# Patient Record
Sex: Female | Born: 1975 | Race: White | Hispanic: No | Marital: Single | Smoking: Current every day smoker
Health system: Southern US, Community
[De-identification: ages and names within clinical notes are randomized; demographics above are authoritative.]

## PROBLEM LIST (undated history)

## (undated) DIAGNOSIS — N159 Renal tubulo-interstitial disease, unspecified: Secondary | ICD-10-CM

## (undated) DIAGNOSIS — M549 Dorsalgia, unspecified: Secondary | ICD-10-CM

## (undated) DIAGNOSIS — F419 Anxiety disorder, unspecified: Secondary | ICD-10-CM

## (undated) DIAGNOSIS — IMO0002 Reserved for concepts with insufficient information to code with codable children: Secondary | ICD-10-CM

## (undated) DIAGNOSIS — G709 Myoneural disorder, unspecified: Secondary | ICD-10-CM

## (undated) DIAGNOSIS — R87619 Unspecified abnormal cytological findings in specimens from cervix uteri: Secondary | ICD-10-CM

## (undated) DIAGNOSIS — K219 Gastro-esophageal reflux disease without esophagitis: Secondary | ICD-10-CM

## (undated) DIAGNOSIS — F329 Major depressive disorder, single episode, unspecified: Secondary | ICD-10-CM

## (undated) DIAGNOSIS — F32A Depression, unspecified: Secondary | ICD-10-CM

## (undated) DIAGNOSIS — G8929 Other chronic pain: Secondary | ICD-10-CM

## (undated) HISTORY — PX: CYSTOSCOPY WITH URETHRAL DILATATION: SHX5125

## (undated) HISTORY — PX: WISDOM TOOTH EXTRACTION: SHX21

## (undated) HISTORY — DX: Reserved for concepts with insufficient information to code with codable children: IMO0002

## (undated) HISTORY — DX: Myoneural disorder, unspecified: G70.9

## (undated) HISTORY — DX: Gastro-esophageal reflux disease without esophagitis: K21.9

## (undated) HISTORY — DX: Unspecified abnormal cytological findings in specimens from cervix uteri: R87.619

---

## 2000-10-06 HISTORY — PX: COLPOSCOPY: SHX161

## 2007-08-19 ENCOUNTER — Emergency Department (HOSPITAL_COMMUNITY): Admission: EM | Admit: 2007-08-19 | Discharge: 2007-08-19 | Payer: Self-pay | Admitting: Emergency Medicine

## 2008-10-08 ENCOUNTER — Emergency Department (HOSPITAL_COMMUNITY): Admission: EM | Admit: 2008-10-08 | Discharge: 2008-10-08 | Payer: Self-pay | Admitting: Emergency Medicine

## 2008-10-26 ENCOUNTER — Emergency Department (HOSPITAL_COMMUNITY): Admission: EM | Admit: 2008-10-26 | Discharge: 2008-10-26 | Payer: Self-pay | Admitting: Emergency Medicine

## 2008-12-06 ENCOUNTER — Emergency Department (HOSPITAL_COMMUNITY): Admission: EM | Admit: 2008-12-06 | Discharge: 2008-12-06 | Payer: Self-pay | Admitting: Emergency Medicine

## 2008-12-25 ENCOUNTER — Emergency Department (HOSPITAL_COMMUNITY): Admission: EM | Admit: 2008-12-25 | Discharge: 2008-12-25 | Payer: Self-pay | Admitting: Emergency Medicine

## 2009-12-28 ENCOUNTER — Emergency Department (HOSPITAL_COMMUNITY): Admission: EM | Admit: 2009-12-28 | Discharge: 2009-12-28 | Payer: Self-pay | Admitting: Emergency Medicine

## 2010-04-09 ENCOUNTER — Emergency Department (HOSPITAL_COMMUNITY): Admission: EM | Admit: 2010-04-09 | Discharge: 2010-04-09 | Payer: Self-pay | Admitting: Emergency Medicine

## 2010-05-31 ENCOUNTER — Emergency Department (HOSPITAL_COMMUNITY): Admission: EM | Admit: 2010-05-31 | Discharge: 2010-05-31 | Payer: Self-pay | Admitting: Emergency Medicine

## 2010-10-13 ENCOUNTER — Emergency Department: Payer: Self-pay | Admitting: Unknown Physician Specialty

## 2010-11-28 ENCOUNTER — Emergency Department (HOSPITAL_COMMUNITY)
Admission: EM | Admit: 2010-11-28 | Discharge: 2010-11-28 | Discharge status: Against Medical Advice | Payer: Self-pay | Attending: Emergency Medicine | Admitting: Emergency Medicine

## 2010-11-29 ENCOUNTER — Emergency Department (HOSPITAL_COMMUNITY)
Admission: EM | Admit: 2010-11-29 | Discharge: 2010-11-29 | Disposition: A | Discharge status: Home / Self Care | Payer: Self-pay | Attending: Emergency Medicine | Admitting: Emergency Medicine

## 2010-11-29 DIAGNOSIS — L02219 Cutaneous abscess of trunk, unspecified: Secondary | ICD-10-CM | POA: Insufficient documentation

## 2010-11-29 DIAGNOSIS — R21 Rash and other nonspecific skin eruption: Secondary | ICD-10-CM | POA: Insufficient documentation

## 2010-11-29 DIAGNOSIS — K047 Periapical abscess without sinus: Secondary | ICD-10-CM | POA: Insufficient documentation

## 2010-12-02 LAB — CULTURE, ROUTINE-ABSCESS

## 2010-12-09 ENCOUNTER — Emergency Department (HOSPITAL_COMMUNITY)
Admission: EM | Admit: 2010-12-09 | Discharge: 2010-12-09 | Disposition: A | Discharge status: Home / Self Care | Payer: Self-pay | Attending: Emergency Medicine | Admitting: Emergency Medicine

## 2010-12-09 DIAGNOSIS — R21 Rash and other nonspecific skin eruption: Secondary | ICD-10-CM | POA: Insufficient documentation

## 2010-12-09 DIAGNOSIS — R109 Unspecified abdominal pain: Secondary | ICD-10-CM | POA: Insufficient documentation

## 2010-12-20 LAB — URINE CULTURE
Colony Count: NO GROWTH
Culture  Setup Time: 201108262125
Culture: NO GROWTH

## 2010-12-20 LAB — POCT PREGNANCY, URINE: Preg Test, Ur: NEGATIVE

## 2010-12-20 LAB — URINALYSIS, ROUTINE W REFLEX MICROSCOPIC
Bilirubin Urine: NEGATIVE
Glucose, UA: NEGATIVE mg/dL
Hgb urine dipstick: NEGATIVE
Ketones, ur: NEGATIVE mg/dL
Nitrite: NEGATIVE
Protein, ur: NEGATIVE mg/dL
Specific Gravity, Urine: 1.02 (ref 1.005–1.030)
Urobilinogen, UA: 0.2 mg/dL (ref 0.0–1.0)
pH: 6 (ref 5.0–8.0)

## 2010-12-20 LAB — URINE MICROSCOPIC-ADD ON

## 2011-01-16 LAB — URINALYSIS, ROUTINE W REFLEX MICROSCOPIC
Glucose, UA: NEGATIVE mg/dL
Hgb urine dipstick: NEGATIVE
Ketones, ur: NEGATIVE mg/dL
Protein, ur: NEGATIVE mg/dL
Specific Gravity, Urine: <1.005 — ABNORMAL LOW (ref 1.005–1.030)
pH: 6 (ref 5.0–8.0)

## 2011-01-16 LAB — WET PREP, GENITAL
Trich, Wet Prep: NONE SEEN
Yeast Wet Prep HPF POC: NONE SEEN

## 2011-01-16 LAB — GC/CHLAMYDIA PROBE AMP, GENITAL
Chlamydia, DNA Probe: NEGATIVE
GC Probe Amp, Genital: NEGATIVE

## 2011-01-16 LAB — URINE CULTURE

## 2011-02-16 ENCOUNTER — Emergency Department (HOSPITAL_COMMUNITY)
Admission: EM | Admit: 2011-02-16 | Discharge: 2011-02-16 | Disposition: A | Discharge status: Home / Self Care | Payer: Self-pay | Attending: Emergency Medicine | Admitting: Emergency Medicine

## 2011-02-16 DIAGNOSIS — L738 Other specified follicular disorders: Secondary | ICD-10-CM | POA: Insufficient documentation

## 2011-03-14 ENCOUNTER — Emergency Department (HOSPITAL_COMMUNITY)
Admission: EM | Admit: 2011-03-14 | Discharge: 2011-03-14 | Disposition: A | Discharge status: Home / Self Care | Payer: Self-pay | Attending: Emergency Medicine | Admitting: Emergency Medicine

## 2011-03-14 DIAGNOSIS — K089 Disorder of teeth and supporting structures, unspecified: Secondary | ICD-10-CM | POA: Insufficient documentation

## 2011-03-14 DIAGNOSIS — K029 Dental caries, unspecified: Secondary | ICD-10-CM | POA: Insufficient documentation

## 2011-03-14 DIAGNOSIS — K651 Peritoneal abscess: Secondary | ICD-10-CM | POA: Insufficient documentation

## 2011-03-14 DIAGNOSIS — F17200 Nicotine dependence, unspecified, uncomplicated: Secondary | ICD-10-CM | POA: Insufficient documentation

## 2011-07-15 LAB — CBC
HCT: 36.4
Hemoglobin: 12.3
MCV: 91.6
Platelets: 190
RBC: 3.98
WBC: 8.8

## 2011-07-15 LAB — COMPREHENSIVE METABOLIC PANEL
ALT: 24
Alkaline Phosphatase: 82
GFR calc non Af Amer: >60
Total Bilirubin: 0.4

## 2011-07-15 LAB — URINALYSIS, ROUTINE W REFLEX MICROSCOPIC
Glucose, UA: NEGATIVE
Specific Gravity, Urine: 1.01

## 2011-07-15 LAB — URINE MICROSCOPIC-ADD ON

## 2011-07-15 LAB — LIPASE, BLOOD: Lipase: 33

## 2011-07-15 LAB — DIFFERENTIAL
Basophils Absolute: 0
Basophils Relative: 0
Lymphocytes Relative: 30
Lymphs Abs: 2.6
Monocytes Relative: 10
Neutrophils Relative %: 58

## 2011-07-15 LAB — PREGNANCY, URINE: Preg Test, Ur: NEGATIVE

## 2011-11-29 ENCOUNTER — Encounter (HOSPITAL_COMMUNITY): Payer: Self-pay

## 2011-11-29 ENCOUNTER — Emergency Department (HOSPITAL_COMMUNITY)
Admission: EM | Admit: 2011-11-29 | Discharge: 2011-11-29 | Disposition: A | Discharge status: Home / Self Care | Payer: Self-pay | Attending: Emergency Medicine | Admitting: Emergency Medicine

## 2011-11-29 DIAGNOSIS — F4321 Adjustment disorder with depressed mood: Secondary | ICD-10-CM | POA: Insufficient documentation

## 2011-11-29 DIAGNOSIS — F17200 Nicotine dependence, unspecified, uncomplicated: Secondary | ICD-10-CM | POA: Insufficient documentation

## 2011-11-29 DIAGNOSIS — F410 Panic disorder [episodic paroxysmal anxiety] without agoraphobia: Secondary | ICD-10-CM | POA: Insufficient documentation

## 2011-11-29 HISTORY — DX: Depression, unspecified: F32.A

## 2011-11-29 HISTORY — DX: Anxiety disorder, unspecified: F41.9

## 2011-11-29 HISTORY — DX: Major depressive disorder, single episode, unspecified: F32.9

## 2011-11-29 LAB — BASIC METABOLIC PANEL
BUN: 17 mg/dL (ref 6–23)
Calcium: 9 mg/dL (ref 8.4–10.5)
Glucose, Bld: 115 mg/dL — ABNORMAL HIGH (ref 70–99)
Sodium: 141 mEq/L (ref 135–145)

## 2011-11-29 LAB — RAPID URINE DRUG SCREEN, HOSP PERFORMED
Amphetamines: NOT DETECTED
Opiates: POSITIVE — AB

## 2011-11-29 LAB — DIFFERENTIAL
Neutro Abs: 5.1 10*3/uL (ref 1.7–7.7)
Neutrophils Relative %: 60 % (ref 43–77)

## 2011-11-29 LAB — CBC
HCT: 39.9 % (ref 36.0–46.0)
Hemoglobin: 13.6 g/dL (ref 12.0–15.0)
MCH: 31.5 pg (ref 26.0–34.0)
MCHC: 34.1 g/dL (ref 30.0–36.0)
MCV: 92.4 fL (ref 78.0–100.0)

## 2011-11-29 LAB — ETHANOL: Alcohol, Ethyl (B): <11 mg/dL (ref 0–11)

## 2011-11-29 MED ORDER — DIAZEPAM 5 MG PO TABS: ORAL_TABLET | ORAL | Status: DC

## 2011-11-29 MED ORDER — ONDANSETRON HCL 4 MG PO TABS: 4.0000 mg | ORAL_TABLET | Freq: Four times a day (QID) | ORAL | Status: AC | PRN

## 2011-11-29 NOTE — ED Provider Notes (Signed)
History     CSN: [id number]  Arrival date & time 11/29/11  1731   First MD Initiated Contact with Patient 11/29/11 1746      Chief Complaint  Patient presents with  . Panic Attack    HPI Pt was seen at 1935.  Per pt, c/o gradual onset and persistence of intermittent episodes of anxiety for the past 2 weeks.  Pt states her symptoms began after her Mother died 2 weeks ago; pt was her sole caregiver before her death.  Pt states she has been experiencing intermittent nausea, crying, and "thinking about her," which interferes with her ability to concentrate at work or read her bible.  Endorses her symptoms worsen when she looks at her mothers room or when she sorts through her belongings; symptoms improve when she "keeps herself busy" running errands out of the house, visiting friends/family, and going to work.  Has been assoc with sadness.  Pt states she came to the ED because "I wanted to make sure I wasn't going crazy" and "to see if this was normal."  Denies SI/SA, no HI.   Past Medical History  Diagnosis Date  . Anxiety   . Depression     History reviewed. No pertinent past surgical history.   History  Substance Use Topics  . Smoking status: Current Everyday Smoker -- 2.0 packs/day  . Smokeless tobacco: Not on file  . Alcohol Use: Yes    Review of Systems ROS: Statement: All systems negative except as marked or noted in the HPI; Constitutional: Negative for fever and chills. ; ; Eyes: Negative for eye pain, redness and discharge. ; ; ENMT: Negative for ear pain, hoarseness, nasal congestion, sinus pressure and sore throat. ; ; Cardiovascular: Negative for chest pain, palpitations, diaphoresis, dyspnea and peripheral edema. ; ; Respiratory: Negative for cough, wheezing and stridor. ; ; Gastrointestinal: Negative for nausea, vomiting, diarrhea, abdominal pain, blood in stool, hematemesis, jaundice and rectal bleeding. . ; ; Genitourinary: Negative for dysuria, flank pain and  hematuria. ; ; Musculoskeletal: Negative for back pain and neck pain. Negative for swelling and trauma.; ; Skin: Negative for pruritus, rash, abrasions, blisters, bruising and skin lesion.; ; Neuro: Negative for headache, lightheadedness and neck stiffness. Negative for weakness, altered level of consciousness , altered mental status, extremity weakness, paresthesias, involuntary movement, seizure and syncope.; Psych:  No SI, no SA, no HI, no hallucinations, +tearful, anxious, depressed.   Allergies  Shellfish allergy  Home Medications   Current Outpatient Rx  Name Route Sig Dispense Refill  . GABAPENTIN 300 MG PO CAPS Oral Take 300 mg by mouth 2 (two) times daily.    Marland Kitchen ONDANSETRON HCL 8 MG PO TABS Oral Take 8 mg by mouth every 4 (four) hours as needed. nausea    . RANITIDINE HCL 150 MG PO TABS Oral Take 150 mg by mouth 2 (two) times daily.      BP 122/92  Pulse 80  Temp(Src) 98.3 F (36.8 C) (Oral)  Ht 5\' 3"  (1.6 m)  Wt 150 lb (68.04 kg)  BMI 26.57 kg/m2  SpO2 99%  Physical Exam 1940: Physical examination:  Nursing notes reviewed; Vital signs and O2 SAT reviewed;  Constitutional: Well developed, Well nourished, Well hydrated, In no acute distress; Head:  Normocephalic, atraumatic; Eyes: EOMI, PERRL, No scleral icterus; ENMT: Mouth and pharynx normal, Mucous membranes moist; Neck: Supple, Full range of motion; Cardiovascular: Regular rate and rhythm; Respiratory: Breath sounds clear, No wheezes, Normal respiratory effort/excursion; Chest: Nontender, Movement normal;  Abdomen: Soft, Nondistended; Extremities: Pulses normal, No tenderness, No edema, No calf edema or asymmetry.; Neuro: AA&Ox3, Major CN grossly intact. No facial droop, speech clear. Gait steady.  No gross focal motor or sensory deficits in extremities.; Skin: Color normal, Warm, Dry; Psych:  Tearful, no SI.    ED Course  Procedures   MDM  MDM Reviewed: nursing note and vitals Interpretation: labs   Results for  orders placed during the hospital encounter of 11/29/11  CBC      Component Value Range   WBC 8.5  4.0 - 10.5 (K/uL)   RBC 4.32  3.87 - 5.11 (MIL/uL)   Hemoglobin 13.6  12.0 - 15.0 (g/dL)   HCT 09.8  11.9 - 14.7 (%)   MCV 92.4  78.0 - 100.0 (fL)   MCH 31.5  26.0 - 34.0 (pg)   MCHC 34.1  30.0 - 36.0 (g/dL)   RDW 82.9  56.2 - 13.0 (%)   Platelets 123 (*) 150 - 400 (K/uL)  DIFFERENTIAL      Component Value Range   Neutrophils Relative 60  43 - 77 (%)   Neutro Abs 5.1  1.7 - 7.7 (K/uL)   Lymphocytes Relative 32  12 - 46 (%)   Lymphs Abs 2.7  0.7 - 4.0 (K/uL)   Monocytes Relative 7  3 - 12 (%)   Monocytes Absolute 0.6  0.1 - 1.0 (K/uL)   Eosinophils Relative 2  0 - 5 (%)   Eosinophils Absolute 0.1  0.0 - 0.7 (K/uL)   Basophils Relative 0  0 - 1 (%)   Basophils Absolute 0.0  0.0 - 0.1 (K/uL)  BASIC METABOLIC PANEL      Component Value Range   Sodium 141  135 - 145 (mEq/L)   Potassium 3.4 (*) 3.5 - 5.1 (mEq/L)   Chloride 109  96 - 112 (mEq/L)   CO2 25  19 - 32 (mEq/L)   Glucose, Bld 115 (*) 70 - 99 (mg/dL)   BUN 17  6 - 23 (mg/dL)   Creatinine, Ser 8.65  0.50 - 1.10 (mg/dL)   Calcium 9.0  8.4 - 78.4 (mg/dL)   GFR calc non Af Amer >90  >90 (mL/min)   GFR calc Af Amer >90  >90 (mL/min)  URINE RAPID DRUG SCREEN (HOSP PERFORMED)      Component Value Range   Opiates POSITIVE (*) NONE DETECTED    Cocaine POSITIVE (*) NONE DETECTED    Benzodiazepines POSITIVE (*) NONE DETECTED    Amphetamines NONE DETECTED  NONE DETECTED    Tetrahydrocannabinol POSITIVE (*) NONE DETECTED    Barbiturates NONE DETECTED  NONE DETECTED   ETHANOL      Component Value Range   Alcohol, Ethyl (B) <11  0 - 11 (mg/dL)     6:96 PM:  Very long d/w pt regarding grief/grief reaction.  Calmer now and wants to go home, states she feels better after talking.  Has her grandmother, friend, and preacher she can speak with.  Endorses she has been trying to get ahold of her former [facility] counselor this past week,  encouraged to keep trying.  Requesting valium to take instead of the ativan (states she has taken it before and "it worked better without making me loopy") and an anti-emetic.  She will take benadryl to help her sleep.  D/W ACT Samara Deist who gave outpt referrals.  Pt wants to go home now, calmer, continues to deny SI.  Will d/c and encourage outpt f/u.  Laray Anger, DO 12/01/11 1339

## 2011-11-29 NOTE — Discharge Instructions (Signed)
RESOURCE GUIDE  Dental Problems  Patients with Medicaid: [facility]                     [facility] [address]                                           [address] Phone:  [phone]                                                  Phone:  [phone]  If unable to pay or uninsured, contact:  [facility] or [facility] to become qualified for the adult dental clinic.  Chronic Pain Problems Contact [facility]  [phone] Patients need to be referred by their primary care doctor.  Insufficient Money for Medicine Contact [employer]:  call "211" or [facility] 271-5999.  No Primary Care Doctor Call [facility]  832-8000 Other agencies that provide inexpensive medical care    [facility]  [phone]    [facility]  [phone]    [facility]  [phone]    Women[facility] Clinic  [phone]    [facility]  [phone]    [facility]  [phone]  Psychological Services [facility]  [phone] [facility]  [phone] [facility]   [phone] (emergency services 641-4993)  Substance Abuse Resources [facility]  336-882-2125 [facility] 336-784-9470 [facility] 336-285-9073 [facility] 336-845-3988 Residential & Outpatient Substance Abuse Program  800-659-3381  Abuse/Neglect [place] Child Abuse Hotline (336) 641-3795 [place] Child Abuse Hotline 800-378-5315 (After Hours)  Emergency Shelter [facility] (336) 271-5985  M[facility] Homes [facility] (336) 275-9566 [facility] (704) 372-4663  MRSA Hotline #:   832-7006    [place] [facility]  [facility] [place]     [employer]                          [facility] [address] [place]                       [address]      [address]  [place]                                                [place]                            [place] Phone:  [phone]                                   Phone:  [phone]                 Phone:  [phone]  [facility] Phone:  [phone]    [facility] Child Abuse Hotline 201-086-3578 501-406-2964 (After Hours)    Take the prescriptions as directed.  Call your regular medical doctor and the mental health referrals given to you today on Monday morning to schedule a follow up appointment within the next 3 to 4 days.  Return to the Emergency Department immediately sooner if worsening.

## 2011-11-29 NOTE — ED Notes (Signed)
Pt states is here for "panic attack". Pt states she was very closer and strong for her Mother,  But mother has recently passed away. Pt reports inability to sleep, feels like the walls are closing in, states she can not read,can not focus or function on a daily. Pt reports taking ativan,. But doesn't like it because it makes her "zoned out".  Pt reports walking off job today because she can't function. Pt reports depression, feeling like there is no reason to take a shower daily. Pt denies SI/HI.

## 2011-11-29 NOTE — ED Notes (Signed)
Pt presents with anxiety x 2 weeks. Pt states her mother passed away 2 weeks ago.

## 2011-12-01 ENCOUNTER — Emergency Department (HOSPITAL_COMMUNITY)
Admission: EM | Admit: 2011-12-01 | Discharge: 2011-12-01 | Disposition: A | Discharge status: Home / Self Care | Payer: Self-pay | Attending: Emergency Medicine | Admitting: Emergency Medicine

## 2011-12-01 ENCOUNTER — Encounter (HOSPITAL_COMMUNITY): Payer: Self-pay | Admitting: *Deleted

## 2011-12-01 DIAGNOSIS — R220 Localized swelling, mass and lump, head: Secondary | ICD-10-CM | POA: Insufficient documentation

## 2011-12-01 DIAGNOSIS — K089 Disorder of teeth and supporting structures, unspecified: Secondary | ICD-10-CM | POA: Insufficient documentation

## 2011-12-01 DIAGNOSIS — R221 Localized swelling, mass and lump, neck: Secondary | ICD-10-CM | POA: Insufficient documentation

## 2011-12-01 DIAGNOSIS — F341 Dysthymic disorder: Secondary | ICD-10-CM | POA: Insufficient documentation

## 2011-12-01 DIAGNOSIS — Z79899 Other long term (current) drug therapy: Secondary | ICD-10-CM | POA: Insufficient documentation

## 2011-12-01 DIAGNOSIS — R070 Pain in throat: Secondary | ICD-10-CM | POA: Insufficient documentation

## 2011-12-01 DIAGNOSIS — F17200 Nicotine dependence, unspecified, uncomplicated: Secondary | ICD-10-CM | POA: Insufficient documentation

## 2011-12-01 DIAGNOSIS — H9209 Otalgia, unspecified ear: Secondary | ICD-10-CM | POA: Insufficient documentation

## 2011-12-01 DIAGNOSIS — K0889 Other specified disorders of teeth and supporting structures: Secondary | ICD-10-CM

## 2011-12-01 DIAGNOSIS — J3489 Other specified disorders of nose and nasal sinuses: Secondary | ICD-10-CM | POA: Insufficient documentation

## 2011-12-01 DIAGNOSIS — R6889 Other general symptoms and signs: Secondary | ICD-10-CM | POA: Insufficient documentation

## 2011-12-01 DIAGNOSIS — J069 Acute upper respiratory infection, unspecified: Secondary | ICD-10-CM | POA: Insufficient documentation

## 2011-12-01 MED ORDER — DIPHENHYDRAMINE HCL 25 MG PO CAPS
25.0000 mg | ORAL_CAPSULE | Freq: Once | ORAL | Status: AC
  Administered 2011-12-01: 25 mg via ORAL
  Filled 2011-12-01: qty 1

## 2011-12-01 MED ORDER — HYDROCODONE-ACETAMINOPHEN 5-325 MG PO TABS
1.0000 | ORAL_TABLET | Freq: Once | ORAL | Status: AC
  Administered 2011-12-01: 1 via ORAL
  Filled 2011-12-01: qty 1

## 2011-12-01 MED ORDER — HYDROCODONE-ACETAMINOPHEN 5-325 MG PO TABS: ORAL_TABLET | ORAL | Status: DC

## 2011-12-01 MED ORDER — PENICILLIN V POTASSIUM 500 MG PO TABS: 500.0000 mg | ORAL_TABLET | Freq: Four times a day (QID) | ORAL | Status: AC

## 2011-12-01 MED ORDER — PENICILLIN V POTASSIUM 250 MG PO TABS
500.0000 mg | ORAL_TABLET | Freq: Once | ORAL | Status: AC
  Administered 2011-12-01: 500 mg via ORAL
  Filled 2011-12-01: qty 2

## 2011-12-01 NOTE — ED Notes (Signed)
Pain lt mandibular molar since last pm.  Has been using ambesol for pain.

## 2011-12-01 NOTE — ED Notes (Signed)
Called x 1 without any response

## 2011-12-01 NOTE — Discharge Instructions (Signed)
Dental Pain     Toothache is pain in or around a tooth. It may get worse with chewing or with cold or heat.   HOME CARE  · Your dentist may use a numbing medicine during treatment. If so, you may need to avoid eating until the medicine wears off. Ask your dentist about this.   · Only take medicine as told by your dentist or doctor.   · Avoid chewing food near the painful tooth until after all treatment is done. Ask your dentist about this.   GET HELP RIGHT AWAY IF:   · The problem gets worse or new problems appear.   · You have a fever.   · There is redness and puffiness (swelling) of the face, jaw, or neck.   · You cannot open your mouth.   · There is pain in the jaw.   · There is very bad pain that is not helped by medicine.   MAKE SURE YOU:   · Understand these instructions.   · Will watch your condition.   · Will get help right away if you are not doing well or get worse.   Document Released: 03/10/2008 Document Revised: 06/04/2011 Document Reviewed: 03/10/2008  [employer]® Patient Information ©2012 [employer].

## 2011-12-01 NOTE — ED Notes (Signed)
Toothache since yesterday, c/o sneezing and itching to throat and ear ache since she has been here

## 2011-12-01 NOTE — ED Provider Notes (Signed)
History     CSN: [id number]  Arrival date & time 12/01/11  1946   First MD Initiated Contact with Patient 12/01/11 2017      Chief Complaint  Patient presents with  . Dental Pain  . Sore Throat    (Consider location/radiation/quality/duration/timing/severity/associated sxs/prior treatment) Patient is a 36 y.o. female presenting with tooth pain. The history is provided by the patient. No language interpreter was used.  Dental PainThe primary symptoms include mouth pain and sore throat. Primary symptoms do not include dental injury, oral bleeding, headaches, fever, shortness of breath, angioedema or cough. Episode onset: one day before ED arrival. The symptoms are worsening. The symptoms are new. The symptoms occur constantly.  Affected locations include: teeth and gum(s).  The sore throat began today. The sore throat has been unchanged since its onset. The sore throat is mild in intensity. The sore throat is not accompanied by trouble swallowing, drooling or hoarse voice.  Additional symptoms include: dental sensitivity to temperature, gum swelling, gum tenderness and ear pain. Additional symptoms do not include: purulent gums, trismus, jaw pain, facial swelling, trouble swallowing, drooling and swollen glands. Associated symptoms comments: Sore throat, nasal congestion and sneezing. Medical issues include: smoking and periodontal disease.    Past Medical History  Diagnosis Date  . Anxiety   . Depression     History reviewed. No pertinent past surgical history.  History reviewed. No pertinent family history.  History  Substance Use Topics  . Smoking status: Current Everyday Smoker -- 2.0 packs/day    Types: Cigarettes  . Smokeless tobacco: Not on file  . Alcohol Use: Yes    OB History    Grav Para Term Preterm Abortions TAB SAB Ect Mult Living                  Review of Systems  Constitutional: Negative for fever, activity change and appetite change.  HENT: Positive  for ear pain, congestion, sore throat, rhinorrhea, sneezing and dental problem. Negative for hoarse voice, facial swelling, drooling, trouble swallowing, neck pain and neck stiffness.   Respiratory: Negative for cough and shortness of breath.   Gastrointestinal: Negative for nausea, vomiting and abdominal pain.  Skin: Negative.   Neurological: Negative for headaches.  Hematological: Negative for adenopathy.  All other systems reviewed and are negative.    Allergies  Shellfish allergy  Home Medications   Current Outpatient Rx  Name Route Sig Dispense Refill  . BENZOCAINE 10 % MT GEL Mouth/Throat Use as directed 1 application in the mouth or throat as needed. For pain    . BENZOCAINE 20 % MT LIQD Mouth/Throat Use as directed 1 application in the mouth or throat every 15 (fifteen) minutes as needed. For dental pain    . DIAZEPAM 5 MG PO TABS  Take one half to one tablet PO BID prn anxiety 10 tablet 0  . GABAPENTIN 300 MG PO CAPS Oral Take 300 mg by mouth 2 (two) times daily.    . IBUPROFEN 200 MG PO TABS Oral Take 200 mg by mouth as needed. For pain    . ONDANSETRON HCL 4 MG PO TABS Oral Take 1 tablet (4 mg total) by mouth every 6 (six) hours as needed for nausea. 8 tablet 0  . RANITIDINE HCL 150 MG PO TABS Oral Take 150 mg by mouth 2 (two) times daily.      BP 105/79  Pulse 89  Temp(Src) 97.9 F (36.6 C) (Oral)  Resp 20  Ht 5\' 3"  (  1.6 m)  Wt 150 lb (68.04 kg)  BMI 26.57 kg/m2  SpO2 100%  Physical Exam  Nursing note and vitals reviewed. Constitutional: She is oriented to person, place, and time. She appears well-developed and well-nourished. No distress.  HENT:  Head: Normocephalic and atraumatic. No trismus in the jaw.  Right Ear: Tympanic membrane and ear canal normal.  Left Ear: Tympanic membrane and ear canal normal.  Nose: Rhinorrhea present. No mucosal edema.  Mouth/Throat: Uvula is midline, oropharynx is clear and moist and mucous membranes are normal. Dental caries  present. No dental abscesses or uvula swelling. No oropharyngeal exudate, posterior oropharyngeal edema, posterior oropharyngeal erythema or tonsillar abscesses.    Neck: Normal range of motion. Neck supple.  Cardiovascular: Normal rate, regular rhythm and normal heart sounds.   No murmur heard. Pulmonary/Chest: Effort normal and breath sounds normal. No respiratory distress.  Lymphadenopathy:    She has no cervical adenopathy.  Neurological: She is alert and oriented to person, place, and time. She exhibits normal muscle tone. Coordination normal.  Skin: Skin is warm and dry.  Psychiatric: She has a normal mood and affect.    ED Course  Procedures (including critical care time)       MDM  Patient was seen here 2 days ago for a grief reaction due to the recent loss of her mother.  I have reviewed to previous ER chart.  Patient states she is trying to arrange f/u with one of the outpt referrals she was given.  Denies SI thoughts   Patient is resting, no acute distress, she is nontoxic appearing. Multiple dental caries.  Tenderness to palpation of the left lower second molar.   Blackened discoloration to the remaining teeth. No dental abscess is seen at this time, no trismus.   Pt stable in ED with no significant deterioration in condition. Patient / Family / Caregiver understand and agree with initial ED impression and plan with expectations set for ED visit.      Aileana Hodder L. [place], [place] 12/04/11 1643

## 2011-12-10 NOTE — ED Provider Notes (Signed)
Evaluation and management procedures were performed by the PA/NP under my supervision/collaboration.   Keimari  D Anasophia Pecor, MD 12/10/11 2020 

## 2011-12-22 ENCOUNTER — Emergency Department (HOSPITAL_COMMUNITY)
Admission: EM | Admit: 2011-12-22 | Discharge: 2011-12-22 | Disposition: A | Discharge status: Home / Self Care | Payer: Self-pay | Attending: Emergency Medicine | Admitting: Emergency Medicine

## 2011-12-22 ENCOUNTER — Encounter (HOSPITAL_COMMUNITY): Payer: Self-pay | Admitting: *Deleted

## 2011-12-22 DIAGNOSIS — K0889 Other specified disorders of teeth and supporting structures: Secondary | ICD-10-CM

## 2011-12-22 DIAGNOSIS — K089 Disorder of teeth and supporting structures, unspecified: Secondary | ICD-10-CM | POA: Insufficient documentation

## 2011-12-22 DIAGNOSIS — F17200 Nicotine dependence, unspecified, uncomplicated: Secondary | ICD-10-CM | POA: Insufficient documentation

## 2011-12-22 DIAGNOSIS — N898 Other specified noninflammatory disorders of vagina: Secondary | ICD-10-CM | POA: Insufficient documentation

## 2011-12-22 LAB — URINALYSIS, ROUTINE W REFLEX MICROSCOPIC
Bilirubin Urine: NEGATIVE
Leukocytes, UA: NEGATIVE
Nitrite: NEGATIVE
Specific Gravity, Urine: <1.005 — ABNORMAL LOW (ref 1.005–1.030)
Urobilinogen, UA: 0.2 mg/dL (ref 0.0–1.0)

## 2011-12-22 LAB — WET PREP, GENITAL: Trich, Wet Prep: NONE SEEN

## 2011-12-22 LAB — POCT PREGNANCY, URINE: Preg Test, Ur: NEGATIVE

## 2011-12-22 MED ORDER — PENICILLIN V POTASSIUM 500 MG PO TABS: 500.0000 mg | ORAL_TABLET | Freq: Four times a day (QID) | ORAL | Status: AC

## 2011-12-22 MED ORDER — HYDROCODONE-ACETAMINOPHEN 5-325 MG PO TABS
1.0000 | ORAL_TABLET | Freq: Once | ORAL | Status: AC
  Administered 2011-12-22: 1 via ORAL
  Filled 2011-12-22: qty 1

## 2011-12-22 MED ORDER — LIDOCAINE HCL (PF) 1 % IJ SOLN
INTRAMUSCULAR | Status: AC
  Administered 2011-12-22: 17:00:00
  Filled 2011-12-22: qty 5

## 2011-12-22 MED ORDER — PENICILLIN V POTASSIUM 250 MG PO TABS
500.0000 mg | ORAL_TABLET | Freq: Once | ORAL | Status: AC
  Administered 2011-12-22: 500 mg via ORAL
  Filled 2011-12-22: qty 2

## 2011-12-22 MED ORDER — HYDROCODONE-ACETAMINOPHEN 5-325 MG PO TABS: 1.0000 | ORAL_TABLET | Freq: Four times a day (QID) | ORAL | Status: AC | PRN

## 2011-12-22 MED ORDER — AZITHROMYCIN 250 MG PO TABS
1000.0000 mg | ORAL_TABLET | Freq: Once | ORAL | Status: AC
  Administered 2011-12-22: 1000 mg via ORAL
  Filled 2011-12-22: qty 4

## 2011-12-22 MED ORDER — CEFTRIAXONE SODIUM 250 MG IJ SOLR
250.0000 mg | Freq: Once | INTRAMUSCULAR | Status: AC
  Administered 2011-12-22: 250 mg via INTRAMUSCULAR
  Filled 2011-12-22: qty 250

## 2011-12-22 NOTE — Discharge Instructions (Signed)
Toothache Toothaches are usually caused by tooth decay (cavity). However, other causes of toothache include:  Gum disease.   Cracked tooth.   Cracked filling.   Injury.   Jaw problem (temporo mandibular joint or TMJ disorder).   Tooth abscess.   Root sensitivity.   Grinding.   Eruption problems.  Swelling and redness around a painful tooth often means you have a dental abscess. Pain medicine and antibiotics can help reduce symptoms, but you will need to see a dentist within the next few days to have your problem properly evaluated and treated. If tooth decay is the problem, you may need a filling or root canal to save your tooth. If the problem is more severe, your tooth may need to be pulled. SEEK IMMEDIATE MEDICAL CARE IF:  You cannot swallow.   You develop severe swelling, increased redness, or increased pain in your mouth or face.   You have a fever.   You cannot open your mouth adequately.  Document Released: 10/30/2004 Document Revised: 09/11/2011 Document Reviewed: 12/20/2009 [facility] Patient Information 2012 [place], [place].Sexually Transmitted Disease Sexually transmitted disease (STD) refers to any infection that is passed from person to person during sexual activity. This may happen by way of saliva, semen, blood, vaginal mucus, or urine. Common STDs include:  Gonorrhea.   Chlamydia.   Syphilis.   HIV/AIDS.   Genital herpes.   Hepatitis B and C.   Trichomonas.   Human papillomavirus (HPV).   Pubic lice.  CAUSES  An STD may be spread by bacteria, virus, or parasite. A person can get an STD by:  Sexual intercourse with an infected person.   Sharing sex toys with an infected person.   Sharing needles with an infected person.   Having intimate contact with the genitals, mouth, or rectal areas of an infected person.  SYMPTOMS  Some people may not have any symptoms, but they can still pass the infection to others. Different STDs have different  symptoms. Symptoms include:  Painful or bloody urination.   Pain in the pelvis, abdomen, vagina, anus, throat, or eyes.   Skin rash, itching, irritation, growths, or sores (lesions). These usually occur in the genital or anal area.   Abnormal vaginal discharge.   Penile discharge in men.   Soft, flesh-colored skin growths in the genital or anal area.   Fever.   Pain or bleeding during sexual intercourse.   Swollen glands in the groin area.   Yellow skin and eyes (jaundice). This is seen with hepatitis.  DIAGNOSIS  To make a diagnosis, your caregiver may:  Take a medical history.   Perform a physical exam.   Take a specimen (culture) to be examined.   Examine a sample of discharge under a microscope.   Perform blood tests.   Perform a Pap test, if this applies.   Perform a colposcopy.   Perform a laparoscopy.  TREATMENT   Chlamydia, gonorrhea, trichomonas, and syphilis can be cured with antibiotic medicine.   Genital herpes, hepatitis, and HIV can be treated, but not cured, with prescribed medicines. The medicines will lessen the symptoms.   Genital warts from HPV can be treated with medicine or by freezing, burning (electrocautery), or surgery. Warts may come back.   HPV is a virus and cannot be cured with medicine or surgery.However, abnormal areas may be followed very closely by your caregiver and may be removed from the cervix, vagina, or vulva through office procedures or surgery.  If your diagnosis is confirmed, your recent  sexual partners need treatment. This is true even if they are symptom-free or have a negative culture or evaluation. They should not have sex until their caregiver says it is okay. HOME CARE INSTRUCTIONS  All sexual partners should be informed, tested, and treated for all STDs.   Take your antibiotics as directed. Finish them even if you start to feel better.   Only take over-the-counter or prescription medicines for pain, discomfort,  or fever as directed by your caregiver.   Rest.   Eat a balanced diet and drink enough fluids to keep your urine clear or pale yellow.   Do not have sex until treatment is completed and you have followed up with your caregiver. STDs should be checked after treatment.   Keep all follow-up appointments, Pap tests, and blood tests as directed by your caregiver.   Only use latex condoms and water-soluble lubricants during sexual activity. Do not use petroleum jelly or oils.   Avoid alcohol and illegal drugs.   Get vaccinated for HPV and hepatitis. If you have not received these vaccines in the past, talk to your caregiver about whether one or both might be right for you.   Avoid risky sex practices that can break the skin.  The only way to avoid getting an STD is to avoid all sexual activity.Latex condoms and dental dams (for oral sex) will help lessen the risk of getting an STD, but will not completely eliminate the risk. SEEK MEDICAL CARE IF:   You have a fever.   You have any new or worsening symptoms.  Document Released: 12/13/2002 Document Revised: 09/11/2011 Document Reviewed: 12/20/2010 [facility] Patient Information 2012 [place], [place].Safer Sex Your caregiver wants you to have this information about the infections that can be transmitted from sexual contact and how to prevent them. The idea behind safer sex is that you can be sexually active, and at the same time reduce the risk of giving or getting a sexually transmitted disease (STD). Every person should be aware of how to prevent him or herself and his or her sex partner from getting an STD. CAUSES OF STDS STDs are transmitted by sharing body fluids, which contain viruses and bacteria. The following fluids all transmit infections during sexual intercourse and sex acts:  Semen.   Saliva.   Urine.   Blood.   Vaginal mucus.  Examples of STDs include:  Chlamydia.   Gonorrhea.   Genital herpes.   Hepatitis B.    Human immunodeficiency virus or acquired immunodeficiency syndrome (HIV or AIDS).   Syphilis.   Trichomonas.   Pubic lice.   Human papillomavirus (HPV), which may include:   Genital warts.   Cervical dysplasia.   Cervical cancer (can develop with certain types of HPV).  SYMPTOMS  Sexual diseases often cause few or no symptoms until they are advanced, so a person can be infected and spread the infection without knowing it. Some STDs respond to treatment very well. Others, like HIV and herpes, cannot be cured, but are treated to reduce their effects. Specific symptoms include:  Abnormal vaginal discharge.   Irritation or itching in and around the vagina, and in the pubic hair.   Pain during sexual intercourse.   Bleeding during sexual intercourse.   Pelvic or abdominal pain.   Fever.   Growths in and around the vagina.   An ulcer in or around the vagina.   Swollen glands in the groin area.  DIAGNOSIS   Blood tests.   Pap test.  Culture test of abnormal vaginal discharge.   A test that applies a solution and examines the cervix with a lighted magnifying scope (colposcopy).   A test that examines the pelvis with a lighted tube, through a small incision (laparoscopy).  TREATMENT  The treatment will depend on the cause of the STD.  Antibiotic treatment by injection, oral, creams, or suppositories in the vagina.   Over-the-counter medicated shampoo, to get rid of pubic lice.   Removing or treating growths with medicine, freezing, burning (electrocautery), or surgery.   Surgery treatment for HPV of the cervix.   Supportive medicines for herpes, HIV, AIDS, and hepatitis.  Being careful cannot eliminate all risk of infection, but sex can be made much safer. Safe sexual practices include body massage and gentle touching. Masturbation is safe, as long as body fluids do not contact skin that has sores or cuts. Dry kissing and oral sex on a man wearing a latex  condom or on a woman wearing a female condom is also safe. Slightly less safe is intercourse while the man wears a latex condom or wet kissing. It is also safer to have one sex partner that you know is not having sex with anyone else. LENGTH OF ILLNESS An STD might be treated and cured in a week, sometimes a month, or more. And it can linger with symptoms for many years. STDs can also cause damage to the female organs. This can cause chronic pain, infertility, and recurrence of the STD, especially herpes, hepatitis, HIV, and HPV. HOME CARE INSTRUCTIONS AND PREVENTION  Alcohol and recreational drugs are often the reason given for not practicing safer sex. These substances affect your judgment. Alcohol and recreational drugs can also impair your immune system, making you more vulnerable to disease.   Do not engage in risky and dangerous sexual practices, including:   Vaginal or anal sex without a condom.   Oral sex on a man without a condom.   Oral sex on a woman without a female condom.   Using saliva to lubricate a condom.   Any other sexual contact in which body fluids or blood from one partner contact the other partner.   You should use only latex condoms for men and water soluble lubricants. Petroleum based lubricants or oils used to lubricate a condom will weaken the condom and increase the chance that it will break.   Think very carefully before having sex with anyone who is high risk for STDs and HIV. This includes IV drug users, people with multiple sexual partners, or people who have had an STD, or a positive hepatitis or HIV blood test.   Remember that even if your partner has had only one previous partner, their previous partner might have had multiple partners. If so, you are at high risk of being exposed to an STD. You and your sex partner should be the only sex partners with each other, with no one else involved.   A vaccine is available for hepatitis B and HPV through your  caregiver or the Public Health Department. Everyone should be vaccinated with these vaccines.   Avoid risky sex practices. Sex acts that can break the skin make you more likely to get an STD.  SEEK MEDICAL CARE IF:   If you think you have an STD, even if you do not have any symptoms. Contact your caregiver for evaluation and treatment, if needed.   You think or know your sex partner has acquired an STD.   You  have any of the symptoms mentioned above.  Document Released: 10/30/2004 Document Revised: 09/11/2011 Document Reviewed: 08/22/2009 [facility] Patient Information 2012 [place], [place].   You have been empirically treated for gonorrhea and chlamydia.  If the syphilis test is positive we will notify you.  Take the antibiotic and pain medicine as directed.  Take ibuprofen 800 mg every 8 hrs with food.  Follow  Up with your dentist as planned.

## 2011-12-22 NOTE — ED Notes (Signed)
States has  A vaginal discharge and dental pain in left lower jaw

## 2011-12-22 NOTE — ED Provider Notes (Signed)
History     CSN: [id number]  Arrival date & time 12/22/11  1313   First MD Initiated Contact with Patient 12/22/11 1558      Chief Complaint  Patient presents with  . Vaginal Discharge    (Consider location/radiation/quality/duration/timing/severity/associated sxs/prior treatment) HPI Comments: Recent intercourse.  Used condom but it broke.  Also having L molar pain.  Is planning to see dentist at [facility] dept next week.  Told that she needed to be on abx for possible abscess.  Patient is a 36 y.o. female presenting with vaginal discharge. The history is provided by the patient. No language interpreter was used.  Vaginal Discharge This is a new problem. Episode onset: 2 days ago. The problem occurs constantly. The problem has been unchanged. Pertinent negatives include no abdominal pain or fever. The symptoms are aggravated by nothing. She has tried nothing for the symptoms.    Past Medical History  Diagnosis Date  . Anxiety   . Depression     History reviewed. No pertinent past surgical history.  No family history on file.  History  Substance Use Topics  . Smoking status: Current Everyday Smoker -- 2.0 packs/day    Types: Cigarettes  . Smokeless tobacco: Not on file  . Alcohol Use: Yes    OB History    Grav Para Term Preterm Abortions TAB SAB Ect Mult Living                  Review of Systems  Constitutional: Negative for fever.  HENT: Positive for dental problem.   Gastrointestinal: Negative for abdominal pain.  Genitourinary: Positive for vaginal discharge. Negative for dysuria, urgency, frequency, hematuria, flank pain, vaginal bleeding, genital sores, vaginal pain and pelvic pain.  All other systems reviewed and are negative.    Allergies  Shellfish allergy and Wellbutrin  Home Medications   Current Outpatient Rx  Name Route Sig Dispense Refill  . BENZOCAINE 10 % MT GEL Mouth/Throat Use as directed 1 application in the mouth or throat as needed. For  pain    . BENZOCAINE 20 % MT LIQD Mouth/Throat Use as directed 1 application in the mouth or throat every 15 (fifteen) minutes as needed. For dental pain    . GABAPENTIN 300 MG PO CAPS Oral Take 300 mg by mouth 4 (four) times daily.     . IBUPROFEN 200 MG PO TABS Oral Take 800 mg by mouth every 3 (three) hours as needed. For pain    . MEDROXYPROGESTERONE ACETATE 150 MG/ML IM SUSP Intramuscular Inject 150 mg into the muscle every 3 (three) months.    Marland Kitchen RANITIDINE HCL 150 MG PO TABS Oral Take 150 mg by mouth 2 (two) times daily.    Marland Kitchen HYDROCODONE-ACETAMINOPHEN 5-325 MG PO TABS Oral Take 1 tablet by mouth every 6 (six) hours as needed for pain. 12 tablet 0  . PENICILLIN V POTASSIUM 500 MG PO TABS Oral Take 1 tablet (500 mg total) by mouth 4 (four) times daily. 40 tablet 0    BP 115/84  Pulse 94  Temp 98 F (36.7 C)  Resp 20  Ht 5\' 3"  (1.6 m)  Wt 160 lb (72.576 kg)  BMI 28.34 kg/m2  SpO2 100%  Physical Exam  Nursing note and vitals reviewed. Constitutional: She is oriented to person, place, and time. She appears well-developed and well-nourished. No distress.  HENT:  Head: Normocephalic and atraumatic.  Right Ear: External ear normal.  Left Ear: External ear normal.  Mouth/Throat: Uvula is midline,  oropharynx is clear and moist and mucous membranes are normal. No uvula swelling. No oropharyngeal exudate, posterior oropharyngeal edema, posterior oropharyngeal erythema or tonsillar abscesses.    Eyes: EOM are normal.  Neck: Normal range of motion.  Cardiovascular: Normal rate, regular rhythm and normal heart sounds.   Pulmonary/Chest: Effort normal and breath sounds normal.  Abdominal: Soft. Normal appearance. She exhibits no distension. There is no tenderness. There is no rebound and no CVA tenderness.  Genitourinary: Pelvic exam was performed with patient supine. No erythema, tenderness or bleeding around the vagina. No foreign body around the vagina. No signs of injury around the  vagina. Vaginal discharge found.       White d/c noted at cervix and pooled in posterior fornix.  No CMT or adnexal tenderness on bimanual exam.  Musculoskeletal: Normal range of motion.  Neurological: She is alert and oriented to person, place, and time.  Skin: Skin is warm and dry.  Psychiatric: She has a normal mood and affect. Judgment normal.    ED Course  Procedures (including critical care time)  Labs Reviewed  URINALYSIS, ROUTINE W REFLEX MICROSCOPIC - Abnormal; Notable for the following:    Specific Gravity, Urine <1.005 (*)    All other components within normal limits  WET PREP, GENITAL - Abnormal; Notable for the following:    Clue Cells Wet Prep HPF POC FEW (*)    WBC, Wet Prep HPF POC FEW (*)    All other components within normal limits  POCT PREGNANCY, URINE  GC/CHLAMYDIA PROBE AMP, GENITAL  RPR   No results found.   1. Vaginal Discharge   2. Pain, dental       MDM  Rocephin and zithromax in ED. rx- pen VK rx hydrocodone OTC ibuprofen F/u with dentist as planned.        Worthy Rancher, PA 12/22/11 1706  Worthy Rancher, PA 12/22/11 [phone]

## 2011-12-22 NOTE — ED Notes (Signed)
Pelvic exam done.

## 2011-12-22 NOTE — ED Notes (Signed)
No untoward reaction to antibiotic

## 2011-12-22 NOTE — ED Notes (Signed)
Dental pain, and vag d/c.  Alert, NAD,

## 2011-12-23 LAB — GC/CHLAMYDIA PROBE AMP, GENITAL: Chlamydia, DNA Probe: NEGATIVE

## 2011-12-26 NOTE — ED Provider Notes (Signed)
Medical screening examination/treatment/procedure(s) were performed by non-physician practitioner and as supervising physician I was immediately available for consultation/collaboration.   Shelda Jakes, MD 12/26/11 [phone]

## 2012-02-15 ENCOUNTER — Encounter (HOSPITAL_COMMUNITY): Payer: Self-pay | Admitting: *Deleted

## 2012-02-15 ENCOUNTER — Emergency Department (HOSPITAL_COMMUNITY)
Admission: EM | Admit: 2012-02-15 | Discharge: 2012-02-15 | Disposition: A | Discharge status: Home / Self Care | Payer: Self-pay | Attending: Emergency Medicine | Admitting: Emergency Medicine

## 2012-02-15 DIAGNOSIS — K029 Dental caries, unspecified: Secondary | ICD-10-CM | POA: Insufficient documentation

## 2012-02-15 DIAGNOSIS — K089 Disorder of teeth and supporting structures, unspecified: Secondary | ICD-10-CM | POA: Insufficient documentation

## 2012-02-15 DIAGNOSIS — K0889 Other specified disorders of teeth and supporting structures: Secondary | ICD-10-CM

## 2012-02-15 DIAGNOSIS — F341 Dysthymic disorder: Secondary | ICD-10-CM | POA: Insufficient documentation

## 2012-02-15 MED ORDER — PROMETHAZINE HCL 12.5 MG PO TABS
12.5000 mg | ORAL_TABLET | Freq: Once | ORAL | Status: AC
  Administered 2012-02-15: 12.5 mg via ORAL
  Filled 2012-02-15: qty 1

## 2012-02-15 MED ORDER — CEPHALEXIN 500 MG PO CAPS
500.0000 mg | ORAL_CAPSULE | Freq: Once | ORAL | Status: AC
  Administered 2012-02-15: 500 mg via ORAL
  Filled 2012-02-15: qty 1

## 2012-02-15 MED ORDER — MELOXICAM 7.5 MG PO TABS: ORAL_TABLET | ORAL | Status: DC

## 2012-02-15 MED ORDER — HYDROCODONE-ACETAMINOPHEN 5-325 MG PO TABS: 1.0000 | ORAL_TABLET | ORAL | Status: AC | PRN

## 2012-02-15 MED ORDER — CEPHALEXIN 500 MG PO CAPS: 500.0000 mg | ORAL_CAPSULE | Freq: Four times a day (QID) | ORAL | Status: AC

## 2012-02-15 MED ORDER — HYDROCODONE-ACETAMINOPHEN 5-325 MG PO TABS
2.0000 | ORAL_TABLET | Freq: Once | ORAL | Status: AC
  Administered 2012-02-15: 2 via ORAL
  Filled 2012-02-15: qty 2

## 2012-02-15 NOTE — ED Notes (Signed)
Dental pain since 0200

## 2012-02-15 NOTE — ED Provider Notes (Signed)
History     CSN: [id number]  Arrival date & time 02/15/12  1115   First MD Initiated Contact with Patient 02/15/12 1152      Chief Complaint  Patient presents with  . Dental Pain    (Consider location/radiation/quality/duration/timing/severity/associated sxs/prior treatment) Patient is a 36 y.o. female presenting with tooth pain. The history is provided by the patient.  Dental PainThe primary symptoms include mouth pain. Primary symptoms do not include shortness of breath or cough. The symptoms began 6 to 12 hours ago. The symptoms are worsening. The symptoms occur constantly.  Additional symptoms include: dental sensitivity to temperature and gum tenderness. Additional symptoms do not include: trouble swallowing and nosebleeds. Medical issues include: smoking.    Past Medical History  Diagnosis Date  . Anxiety   . Depression     History reviewed. No pertinent past surgical history.  No family history on file.  History  Substance Use Topics  . Smoking status: Current Everyday Smoker -- 2.0 packs/day    Types: Cigarettes  . Smokeless tobacco: Not on file  . Alcohol Use: Yes    OB History    Grav Para Term Preterm Abortions TAB SAB Ect Mult Living                  Review of Systems  Constitutional: Negative for activity change.       All ROS Neg except as noted in HPI  HENT: Positive for dental problem. Negative for nosebleeds, trouble swallowing and neck pain.   Eyes: Negative for photophobia and discharge.  Respiratory: Negative for cough, shortness of breath and wheezing.   Cardiovascular: Negative for chest pain and palpitations.  Gastrointestinal: Negative for abdominal pain and blood in stool.  Genitourinary: Negative for dysuria, frequency and hematuria.  Musculoskeletal: Negative for back pain and arthralgias.  Skin: Negative.   Neurological: Negative for dizziness, seizures and speech difficulty.  Psychiatric/Behavioral: Negative for hallucinations and  confusion. The patient is nervous/anxious.     Allergies  Shellfish allergy and Wellbutrin  Home Medications   Current Outpatient Rx  Name Route Sig Dispense Refill  . BENZOCAINE 10 % MT GEL Mouth/Throat Use as directed 1 application in the mouth or throat as needed. For pain    . BENZOCAINE 20 % MT LIQD Mouth/Throat Use as directed 1 application in the mouth or throat every 15 (fifteen) minutes as needed. For dental pain    . GABAPENTIN 300 MG PO CAPS Oral Take 300 mg by mouth 4 (four) times daily.     . IBUPROFEN 200 MG PO TABS Oral Take 800 mg by mouth every 3 (three) hours as needed. For pain    . MEDROXYPROGESTERONE ACETATE 150 MG/ML IM SUSP Intramuscular Inject 150 mg into the muscle every 3 (three) months.    Marland Kitchen RANITIDINE HCL 150 MG PO TABS Oral Take 150 mg by mouth 2 (two) times daily.      BP 152/103  Pulse 83  Temp(Src) 98.4 F (36.9 C) (Oral)  Resp 16  SpO2 100%  Physical Exam  Nursing note and vitals reviewed. Constitutional: She is oriented to person, place, and time. She appears well-developed and well-nourished.  Non-toxic appearance.  HENT:  Head: Normocephalic.  Right Ear: Tympanic membrane and external ear normal.  Left Ear: Tympanic membrane and external ear normal.       Multiple dental caries  Eyes: EOM and lids are normal. Pupils are equal, round, and reactive to light.  Neck: Normal range of motion. Neck  supple. Carotid bruit is not present.  Cardiovascular: Normal rate, regular rhythm, normal heart sounds, intact distal pulses and normal pulses.   Pulmonary/Chest: Breath sounds normal. No respiratory distress.  Abdominal: Soft. Bowel sounds are normal. There is no tenderness. There is no guarding.  Musculoskeletal: Normal range of motion.  Lymphadenopathy:       Head (right side): No submandibular adenopathy present.       Head (left side): No submandibular adenopathy present.    She has no cervical adenopathy.  Neurological: She is alert and  oriented to person, place, and time. She has normal strength. No cranial nerve deficit or sensory deficit.  Skin: Skin is warm and dry.  Psychiatric: Her speech is normal. Her mood appears anxious.    ED Course  Procedures (including critical care time)  Labs Reviewed - No data to display No results found.   No diagnosis found.    MDM  I have reviewed nursing notes, vital signs, and all appropriate lab and imaging results for this patient. Pt to see the oral surg later this week . She request assistance with her pain. Rx for keflex, mobic, and norco.       Kathie Dike, [place] 02/15/12 1202

## 2012-02-15 NOTE — Discharge Instructions (Signed)
Toothache Toothaches are usually caused by tooth decay (cavity). However, other causes of toothache include:  Gum disease.   Cracked tooth.   Cracked filling.   Injury.   Jaw problem (temporo mandibular joint or TMJ disorder).   Tooth abscess.   Root sensitivity.   Grinding.   Eruption problems.  Swelling and redness around a painful tooth often means you have a dental abscess. Pain medicine and antibiotics can help reduce symptoms, but you will need to see a dentist within the next few days to have your problem properly evaluated and treated. If tooth decay is the problem, you may need a filling or root canal to save your tooth. If the problem is more severe, your tooth may need to be pulled. SEEK IMMEDIATE MEDICAL CARE IF:  You cannot swallow.   You develop severe swelling, increased redness, or increased pain in your mouth or face.   You have a fever.   You cannot open your mouth adequately.  Document Released: 10/30/2004 Document Revised: 09/11/2011 Document Reviewed: 12/20/2009 [employer] Patient Information 2012 [employer]. 

## 2012-02-15 NOTE — ED Provider Notes (Signed)
Medical screening examination/treatment/procedure(s) were performed by non-physician practitioner and as supervising physician I was immediately available for consultation/collaboration.   Jozelynn Danielson L Allen Egerton, MD 02/15/12 1439 

## 2012-02-15 NOTE — ED Notes (Signed)
Pt a/ox4. Resp even and unlabored. NAD at this time. D/ C instructions reviewed with pt. Pt verbalized understanding. Pt ambulated to d/c desk with steady gate.  

## 2012-05-12 ENCOUNTER — Encounter: Payer: Self-pay | Admitting: Obstetrics & Gynecology

## 2012-05-12 ENCOUNTER — Ambulatory Visit (INDEPENDENT_AMBULATORY_CARE_PROVIDER_SITE_OTHER): Payer: Self-pay | Admitting: Obstetrics & Gynecology

## 2012-05-12 ENCOUNTER — Other Ambulatory Visit (HOSPITAL_COMMUNITY)
Admission: RE | Admit: 2012-05-12 | Discharge: 2012-05-12 | Disposition: A | Discharge status: Home / Self Care | Payer: Self-pay | Source: Ambulatory Visit | Attending: Obstetrics & Gynecology | Admitting: Obstetrics & Gynecology

## 2012-05-12 VITALS — BP 125/86 | HR 74 | Temp 97.8°F | Resp 16 | Ht 63.0 in | Wt 161.3 lb

## 2012-05-12 DIAGNOSIS — R87619 Unspecified abnormal cytological findings in specimens from cervix uteri: Secondary | ICD-10-CM | POA: Insufficient documentation

## 2012-05-12 DIAGNOSIS — Z01812 Encounter for preprocedural laboratory examination: Secondary | ICD-10-CM

## 2012-05-12 DIAGNOSIS — R87612 Low grade squamous intraepithelial lesion on cytologic smear of cervix (LGSIL): Secondary | ICD-10-CM

## 2012-05-12 DIAGNOSIS — IMO0002 Reserved for concepts with insufficient information to code with codable children: Secondary | ICD-10-CM

## 2012-05-12 NOTE — Progress Notes (Signed)
Patient given informed consent, signed copy in the chart, time out was performed.  Placed in lithotomy position. Cervix viewed with speculum and colposcope after application of acetic acid.   Colposcopy adequate?  Yes.  Cervix stenotic.  TZ fully seen. Acetowhite lesions?acetowhite ring around TZ Punctation?no Mosaicism?  no Abnormal vasculature?  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  Recommend tobacco cessation.  If pap normal, will have pt f/u in 1 year.  Katasha Riga L. Harraway-Smith, M.D., [place]

## 2012-05-12 NOTE — Patient Instructions (Addendum)
Abnormal Pap Test WHAT DOES A PAP TEST CHECK FOR? A Pap test checks the cells in the cervix for any infections or changes that may turn into cancer. The cells are checked to see if they look normal or if they show any changes (abnormal). Abnormal changes may be a sign of problems with your cervix. Cervical cells that are abnormal are called cervical dysplasia. Dysplasia is not cancer. It is a pre-cancerous change in the cells of your cervix. WHAT DOES AN ABNORMAL PAP TEST MEAN? Most times, an abnormal Pap test does not mean you have cancer. However, it does show that there may be a problem. Your doctor will want to do other tests to find out more about the abnormal cells.  Your abnormal Pap test results could show:  Small changes that should be carefully watched.   Dysplasia that could grow into cancer.   Cancer.  When dysplasia is found and treated early, it most often does not grow into cancer. WHAT WILL BE DONE ABOUT MY ABNORMAL PAP TEST? You may have:  A colposcopy test done. Your cevix will be looked at using a strong light and a microscope.   A cone biopsy. A small, cone-shaped sample of your cervix is taken out. The part that is taken out is the area where the abnormal cells are.   Cryosurgery. The abnormal cells on your cervix will be frozen.   Loop Electrical Excision Procedure (LEEP). The abnormal cells will be taken out.  WHAT IF I HAVE A DYSPLASIA OR A CANCER? You and your doctor may choose a hysterectomy as the best treatment for you. During this surgery, your womb (uterus) and your cervix will be taken out. WHAT SHOULD YOU DO AFTER BEING TREATED? Keep having Pap tests and checkups as often as your doctor tells you. Your cervical problem will be carefully watched so it does not get worse. Also, your doctor can watch for, and treat, any new problems that may come up. Document Released: 01/07/2011 Document Revised: 09/11/2011 Document Reviewed: 08/24/2011 [facility] Patient  Information 2012 [place], [place].Colposcopy Colposcopy is a procedure that uses a special lighted microscope (colposcope). It examines your cervix and vagina, or the area around the outside of the vagina, for signs of disease or abnormalities in the cells. You may be sent to a specialist (gynecologist) to do the colposcopy. A biopsy (tissue sample) may be collected during a colposcopy, if the caregiver finds any unusual cells. The biopsy is sent to the lab for further testing, and the results are reported back to your caregiver. A WOMAN MAY NEED THIS PROCEDURE IF:  She has had an abnormal pap smear (taking cells from the cervix for testing).   She has a sore on her cervix, and a Pap test was normal.   The Pap test suggests human papilloma virus (HPV). This virus can cause genital warts and is linked to the development of cervical cancer.   She has genital warts on the cervix, or in or around the outside of the vagina.   Her mother took the drug DES while pregnant.   She has painful intercourse.   She has vaginal bleeding, especially after sexual intercourse.   There is a need to evaluate the results of previous treatment.  BEFORE THE PROCEDURE   Colposcopy is done when you are not having a menstrual period.   For 24 hours before the colposcopy, do not:   Douche.   Use tampons.   Use medicines, creams, or suppositories in the  vagina.   Have sexual intercourse.  PROCEDURE   A colposcopy is done while a woman is lying on her back with her feet in foot rests (stirrups).   A speculum is placed inside the vagina to keep it open and to allow the caregiver to see the cervix. This is the same instrument used to do a pap smear.   The colposcope is placed outside the vagina. It is used to magnify and examine the cervix, vagina, and the area around the outside of the vagina.   A small amount of liquid solution is placed on the area that is to be viewed. This solution is placed on with a  cotton applicator. This solution makes it easier to see the abnormal cells.   Your caregiver will suck out mucus and cells from the canal of the cervix.   Small pieces of tissue for biopsy may be taken at the same time. You may feel mild pain or discomfort when this is done.   Your caregiver will record the location of the abnormal areas and send the tissue samples to a lab for analysis.   If your caregiver biopsies the vagina or outside of the vagina, a local anesthetic (novocaine) is usually given.  AFTER THE PROCEDURE   You may have some cramping that often goes away in a few minutes. You may have some soreness for a couple of days.   You may take over-the-counter pain medicine as advised by your caregiver. Do not take aspirin because it can cause bleeding.   Lie down for a few minutes if you feel lightheaded.   You may have some bleeding or dark discharge that should stop in a few days.   You may need to wear a sanitary pad for a few days.  HOME CARE INSTRUCTIONS   Avoid sex, douching, and using tampons for a week or as directed.   Only take medicine as directed by your caregiver.   Continue to take birth control pills, if you are on them.   Not all test results are available during your visit. If your test results are not back during the visit, make an appointment with your caregiver to find out the results. Do not assume everything is normal if you have not heard from your caregiver or the medical facility. It is important for you to follow up on all of your test results.   Follow your caregiver's advice regarding medicines, activity, follow-up visits, and follow-up Pap tests.  SEEK MEDICAL CARE IF:   You develop a rash.   You have problems with your medicine.  SEEK IMMEDIATE MEDICAL CARE IF:  You are bleeding heavily or are passing blood clots.   You develop a fever over 102 F (38.9 C), with or without chills.   You have abnormal vaginal discharge.   You are  having cramps that do not go away after taking your pain medicine.   You feel lightheaded, dizzy, or faint.   You develop stomach pain.  Document Released: 12/13/2002 Document Revised: 09/11/2011 Document Reviewed: 07/26/2009 [facility] Patient Information 2012 [place], [place].

## 2012-05-17 ENCOUNTER — Telehealth: Payer: Self-pay | Admitting: *Deleted

## 2012-05-17 NOTE — Telephone Encounter (Signed)
Message copied by Mannie Stabile on Mon May 17, 2012  1:42 PM ------      Message from: Willodean Rosenthal      Created: Mon May 17, 2012  9:36 AM       Good morning!            Pls call pt; notify her of NO DYSPLASIA (precancerous cells) on biopsy and have her f/u for Pap in 1 year.             thx      clh-S

## 2012-05-17 NOTE — Telephone Encounter (Signed)
Pt informed of results. Voiced understanding.

## 2012-06-02 ENCOUNTER — Telehealth: Payer: Self-pay | Admitting: *Deleted

## 2012-06-02 NOTE — Telephone Encounter (Signed)
Pt called w/questions regarding the spotting that she has been having. She reports that since her colpo on 8/7, she has been having spotting daily. She also stated that her last depo provera injection was a new formula which is given in the "fat" of the abdomen or top of leg. Her next Depo is due on 07/01/12. I informed pt that spotting is not concerning and may be from the Depo or the colpo, but not likely from the colpo. I encouraged her to ask @ the [facility] when she receives her next depo injection if the spotting could have anything to do with the new formula. I stated to pt that if she develops heavy bleeding she should call us back or seek medical treatment. Pt voiced understanding.

## 2012-06-17 ENCOUNTER — Other Ambulatory Visit (HOSPITAL_COMMUNITY): Payer: Self-pay | Admitting: Internal Medicine

## 2012-06-17 DIAGNOSIS — Z1231 Encounter for screening mammogram for malignant neoplasm of breast: Secondary | ICD-10-CM

## 2012-06-28 ENCOUNTER — Ambulatory Visit (HOSPITAL_COMMUNITY): Payer: Self-pay

## 2012-07-20 ENCOUNTER — Ambulatory Visit (HOSPITAL_COMMUNITY)
Admission: RE | Admit: 2012-07-20 | Discharge: 2012-07-20 | Disposition: A | Discharge status: Home / Self Care | Payer: Self-pay | Source: Ambulatory Visit | Attending: Internal Medicine | Admitting: Internal Medicine

## 2012-07-20 DIAGNOSIS — Z1231 Encounter for screening mammogram for malignant neoplasm of breast: Secondary | ICD-10-CM

## 2013-03-17 ENCOUNTER — Emergency Department (HOSPITAL_COMMUNITY): Payer: BC Managed Care – PPO

## 2013-03-17 ENCOUNTER — Emergency Department (HOSPITAL_COMMUNITY)
Admission: EM | Admit: 2013-03-17 | Discharge: 2013-03-17 | Disposition: A | Discharge status: Home / Self Care | Payer: BC Managed Care – PPO | Attending: Emergency Medicine | Admitting: Emergency Medicine

## 2013-03-17 ENCOUNTER — Encounter (HOSPITAL_COMMUNITY): Payer: Self-pay

## 2013-03-17 DIAGNOSIS — Y939 Activity, unspecified: Secondary | ICD-10-CM | POA: Insufficient documentation

## 2013-03-17 DIAGNOSIS — Z87448 Personal history of other diseases of urinary system: Secondary | ICD-10-CM | POA: Insufficient documentation

## 2013-03-17 DIAGNOSIS — W010XXA Fall on same level from slipping, tripping and stumbling without subsequent striking against object, initial encounter: Secondary | ICD-10-CM | POA: Insufficient documentation

## 2013-03-17 DIAGNOSIS — F17200 Nicotine dependence, unspecified, uncomplicated: Secondary | ICD-10-CM | POA: Insufficient documentation

## 2013-03-17 DIAGNOSIS — Z8669 Personal history of other diseases of the nervous system and sense organs: Secondary | ICD-10-CM | POA: Insufficient documentation

## 2013-03-17 DIAGNOSIS — Z79899 Other long term (current) drug therapy: Secondary | ICD-10-CM | POA: Insufficient documentation

## 2013-03-17 DIAGNOSIS — Z8659 Personal history of other mental and behavioral disorders: Secondary | ICD-10-CM | POA: Insufficient documentation

## 2013-03-17 DIAGNOSIS — Y929 Unspecified place or not applicable: Secondary | ICD-10-CM | POA: Insufficient documentation

## 2013-03-17 DIAGNOSIS — K219 Gastro-esophageal reflux disease without esophagitis: Secondary | ICD-10-CM | POA: Insufficient documentation

## 2013-03-17 DIAGNOSIS — S93409A Sprain of unspecified ligament of unspecified ankle, initial encounter: Secondary | ICD-10-CM | POA: Insufficient documentation

## 2013-03-17 MED ORDER — IBUPROFEN 800 MG PO TABS: 800.0000 mg | ORAL_TABLET | Freq: Three times a day (TID) | ORAL | Status: DC

## 2013-03-17 MED ORDER — HYDROCODONE-ACETAMINOPHEN 5-325 MG PO TABS
1.0000 | ORAL_TABLET | Freq: Once | ORAL | Status: AC
  Administered 2013-03-17: 1 via ORAL
  Filled 2013-03-17: qty 1

## 2013-03-17 MED ORDER — HYDROCODONE-ACETAMINOPHEN 5-325 MG PO TABS: 1.0000 | ORAL_TABLET | Freq: Four times a day (QID) | ORAL | Status: DC | PRN

## 2013-03-17 NOTE — ED Notes (Signed)
Pt reports slipping on wet grass last night and turning rt ankle.  No obvious injury or deformity.  Pt alert oriented X4

## 2013-03-17 NOTE — ED Notes (Signed)
Pt presents with R foot and ankle pain after slipping on wet grass twisting ankle last night.  Pt is able to bear weight, tramadol taken this morning.

## 2013-03-17 NOTE — ED Provider Notes (Signed)
Medical screening examination/treatment/procedure(s) were performed by non-physician practitioner and as supervising physician I was immediately available for consultation/collaboration.   Jakaylah Schlafer, MD 03/17/13 1602 

## 2013-03-17 NOTE — ED Provider Notes (Signed)
History     CSN: [id number]  Arrival date & time 03/17/13  1310   First MD Initiated Contact with Patient 03/17/13 1406      No chief complaint on file.   (Consider location/radiation/quality/duration/timing/severity/associated sxs/prior treatment) HPI  Courtney Ponce is a 37 y.o.female presenting to the ER with complaints of right foot and ankle pain after slipping due to the rain yesterday in the wet grass. She is able to put a small amount of weight on it but endorses being unable to get around to work. She has tried tramadol which has helped as long as she isn't moving her ankle. She denies injury to her head or neck. Denies loc, nausea or vomiting. Says she has a PMH positive for anxiety, depression, GERD and neuromuscular disorder (unspecified). nad vss  Past Medical History  Diagnosis Date  . Anxiety   . Depression   . Abnormal Pap smear   . GERD (gastroesophageal reflux disease)   . Neuromuscular disorder     Past Surgical History  Procedure Laterality Date  . Wisdom tooth extraction    . Cystoscopy with urethral dilatation      age 68  . Colposcopy  2002    Family History  Problem Relation Age of Onset  . Cancer Mother     breast w/metastasis to liver,lung,bone, brain  . Hypertension Sister   . Thyroid disease Sister   . Cancer Maternal Aunt     ovarian w/metastasis to colon    History  Substance Use Topics  . Smoking status: Current Every Day Smoker -- 1.00 packs/day for 22 years    Types: Cigarettes  . Smokeless tobacco: Not on file  . Alcohol Use: Yes     Comment: socially    OB History   Grav Para Term Preterm Abortions TAB SAB Ect Mult Living   1 1 1       1       Review of Systems  Musculoskeletal: Positive for joint swelling.  All other systems reviewed and are negative.    Allergies  Shellfish allergy and Wellbutrin  Home Medications   Current Outpatient Rx  Name  Route  Sig  Dispense  Refill  . B Complex-C (SUPER B COMPLEX PO)    Oral   Take 1 tablet by mouth daily.         . diazepam (VALIUM) 10 MG tablet   Oral   Take 10 mg by mouth at bedtime as needed for sleep.          Marland Kitchen gabapentin (NEURONTIN) 300 MG capsule   Oral   Take 300 mg by mouth 4 (four) times daily.          . medroxyPROGESTERone (DEPO-PROVERA) 150 MG/ML injection   Intramuscular   Inject 150 mg into the muscle every 3 (three) months.         . Multiple Vitamin (MULTIVITAMIN WITH MINERALS) TABS   Oral   Take 1 tablet by mouth daily.         . ranitidine (ZANTAC) 150 MG tablet   Oral   Take 150 mg by mouth daily.          . traMADol (ULTRAM) 50 MG tablet   Oral   Take 50 mg by mouth every 8 (eight) hours as needed for pain.          Marland Kitchen HYDROcodone-acetaminophen (NORCO/VICODIN) 5-325 MG per tablet   Oral   Take 1 tablet by mouth every 6 (six) hours  as needed for pain.   15 tablet   0   . ibuprofen (ADVIL,MOTRIN) 800 MG tablet   Oral   Take 1 tablet (800 mg total) by mouth 3 (three) times daily.   21 tablet   0     BP 134/94  Pulse 71  Temp(Src) 98.3 F (36.8 C) (Oral)  Resp 18  SpO2 98%  Physical Exam  Nursing note and vitals reviewed. Constitutional: She appears well-developed and well-nourished. No distress.  HENT:  Head: Normocephalic and atraumatic.  Eyes: Pupils are equal, round, and reactive to light.  Neck: Normal range of motion. Neck supple.  Cardiovascular: Normal rate and regular rhythm.   Pulmonary/Chest: Effort normal.  Abdominal: Soft.  Musculoskeletal:       Right ankle: She exhibits decreased range of motion and swelling. She exhibits no ecchymosis, no deformity, no laceration and normal pulse. Tenderness. Lateral malleolus tenderness found. No medial malleolus tenderness found. Achilles tendon normal.  Neurological: She is alert.  Skin: Skin is warm and dry.    ED Course  Procedures (including critical care time)  Labs Reviewed - No data to display Dg Ankle 2 Views  Right  03/17/2013   *RADIOLOGY REPORT*  Clinical Data: Fall with ankle pain and injury.  RIGHT ANKLE - 2 VIEW  Comparison: None  Findings: Lateral soft tissue swelling is noted. There is no evidence of acute fracture or subluxation. The ankle mortise is intact. A 7 mm linear radiopaque foreign body is identified in the plantar soft tissues of the foot.  IMPRESSION: Soft tissue swelling without acute bony abnormality.  7 mm linear foreign body in the plantar soft tissues - uncertain chronicity.   Original Report Authenticated By: Harmon Pier, M.D.   Dg Foot Complete Right  03/17/2013   *RADIOLOGY REPORT*  Clinical Data: Injury with fall and pain.  RIGHT FOOT COMPLETE - 3+ VIEW  Comparison: None  Findings: A 7 mm linear radiopaque foreign body is identified in the plantar soft tissues at the level of the mid third metatarsal. There is no evidence of acute fracture, subluxation or dislocation. The Lisfranc joints are intact. No focal bony lesions are present.  IMPRESSION: 7 mm linear radiopaque foreign body in the soft tissues of the foot.  No evidence of acute bony abnormality.   Original Report Authenticated By: Harmon Pier, M.D.     1. Ankle sprain and strain, right, initial encounter       MDM  Pt xrays are negative. Pt given ASO splint and crutches as well as pain medication.  Referral to Ortho. Most likely simple sprain.   37 y.o.Courtney Ponce's evaluation in the Emergency Department is complete. It has been determined that no acute conditions requiring further emergency intervention are present at this time. The patient/guardian have been advised of the diagnosis and plan. We have discussed signs and symptoms that warrant return to the ED, such as changes or worsening in symptoms.  Vital signs are stable at discharge. Filed Vitals:   03/17/13 1450  BP: 134/94  Pulse: 71  Temp:   Resp: 18    Patient/guardian has voiced understanding and agreed to follow-up with the PCP or  specialist.         Dorthula Matas, PA-C 03/17/13 1459

## 2013-10-06 ENCOUNTER — Emergency Department (HOSPITAL_COMMUNITY): Payer: BC Managed Care – PPO

## 2013-10-06 ENCOUNTER — Emergency Department (HOSPITAL_COMMUNITY)
Admission: EM | Admit: 2013-10-06 | Discharge: 2013-10-06 | Disposition: A | Discharge status: Home / Self Care | Payer: BC Managed Care – PPO | Attending: Emergency Medicine | Admitting: Emergency Medicine

## 2013-10-06 ENCOUNTER — Encounter (HOSPITAL_COMMUNITY): Payer: Self-pay | Admitting: Emergency Medicine

## 2013-10-06 DIAGNOSIS — S62101A Fracture of unspecified carpal bone, right wrist, initial encounter for closed fracture: Secondary | ICD-10-CM

## 2013-10-06 DIAGNOSIS — S6990XA Unspecified injury of unspecified wrist, hand and finger(s), initial encounter: Secondary | ICD-10-CM | POA: Insufficient documentation

## 2013-10-06 DIAGNOSIS — W19XXXA Unspecified fall, initial encounter: Secondary | ICD-10-CM

## 2013-10-06 DIAGNOSIS — Z23 Encounter for immunization: Secondary | ICD-10-CM | POA: Insufficient documentation

## 2013-10-06 DIAGNOSIS — S59919A Unspecified injury of unspecified forearm, initial encounter: Secondary | ICD-10-CM

## 2013-10-06 DIAGNOSIS — IMO0002 Reserved for concepts with insufficient information to code with codable children: Secondary | ICD-10-CM | POA: Insufficient documentation

## 2013-10-06 DIAGNOSIS — Z79899 Other long term (current) drug therapy: Secondary | ICD-10-CM | POA: Insufficient documentation

## 2013-10-06 DIAGNOSIS — Z791 Long term (current) use of non-steroidal anti-inflammatories (NSAID): Secondary | ICD-10-CM | POA: Insufficient documentation

## 2013-10-06 DIAGNOSIS — F329 Major depressive disorder, single episode, unspecified: Secondary | ICD-10-CM | POA: Insufficient documentation

## 2013-10-06 DIAGNOSIS — K219 Gastro-esophageal reflux disease without esophagitis: Secondary | ICD-10-CM | POA: Insufficient documentation

## 2013-10-06 DIAGNOSIS — F17200 Nicotine dependence, unspecified, uncomplicated: Secondary | ICD-10-CM | POA: Insufficient documentation

## 2013-10-06 DIAGNOSIS — F3289 Other specified depressive episodes: Secondary | ICD-10-CM | POA: Insufficient documentation

## 2013-10-06 DIAGNOSIS — S0100XA Unspecified open wound of scalp, initial encounter: Secondary | ICD-10-CM | POA: Insufficient documentation

## 2013-10-06 DIAGNOSIS — S0101XA Laceration without foreign body of scalp, initial encounter: Secondary | ICD-10-CM

## 2013-10-06 DIAGNOSIS — G8929 Other chronic pain: Secondary | ICD-10-CM | POA: Insufficient documentation

## 2013-10-06 DIAGNOSIS — W1809XA Striking against other object with subsequent fall, initial encounter: Secondary | ICD-10-CM | POA: Insufficient documentation

## 2013-10-06 DIAGNOSIS — S59909A Unspecified injury of unspecified elbow, initial encounter: Secondary | ICD-10-CM | POA: Insufficient documentation

## 2013-10-06 DIAGNOSIS — F411 Generalized anxiety disorder: Secondary | ICD-10-CM | POA: Insufficient documentation

## 2013-10-06 DIAGNOSIS — Y9389 Activity, other specified: Secondary | ICD-10-CM | POA: Insufficient documentation

## 2013-10-06 DIAGNOSIS — W010XXA Fall on same level from slipping, tripping and stumbling without subsequent striking against object, initial encounter: Secondary | ICD-10-CM | POA: Insufficient documentation

## 2013-10-06 DIAGNOSIS — S62109A Fracture of unspecified carpal bone, unspecified wrist, initial encounter for closed fracture: Secondary | ICD-10-CM | POA: Insufficient documentation

## 2013-10-06 DIAGNOSIS — Y929 Unspecified place or not applicable: Secondary | ICD-10-CM | POA: Insufficient documentation

## 2013-10-06 HISTORY — DX: Other chronic pain: G89.29

## 2013-10-06 HISTORY — DX: Dorsalgia, unspecified: M54.9

## 2013-10-06 LAB — BASIC METABOLIC PANEL
BUN: 17 mg/dL (ref 6–23)
CALCIUM: 9.7 mg/dL (ref 8.4–10.5)
CO2: 24 mEq/L (ref 19–32)
Chloride: 105 mEq/L (ref 96–112)
Creatinine, Ser: 0.74 mg/dL (ref 0.50–1.10)
Glucose, Bld: 107 mg/dL — ABNORMAL HIGH (ref 70–99)
Potassium: 4.2 mEq/L (ref 3.7–5.3)
SODIUM: 143 meq/L (ref 137–147)

## 2013-10-06 LAB — CBC
HCT: 45.3 % (ref 36.0–46.0)
Hemoglobin: 15.4 g/dL — ABNORMAL HIGH (ref 12.0–15.0)
MCH: 33.8 pg (ref 26.0–34.0)
MCHC: 34 g/dL (ref 30.0–36.0)
MCV: 99.3 fL (ref 78.0–100.0)
PLATELETS: 186 10*3/uL (ref 150–400)
RBC: 4.56 MIL/uL (ref 3.87–5.11)
RDW: 12 % (ref 11.5–15.5)
WBC: 10.8 10*3/uL — ABNORMAL HIGH (ref 4.0–10.5)

## 2013-10-06 MED ORDER — ONDANSETRON 8 MG PO TBDP: 8.0000 mg | ORAL_TABLET | Freq: Three times a day (TID) | ORAL | Status: DC | PRN

## 2013-10-06 MED ORDER — TETANUS-DIPHTH-ACELL PERTUSSIS 5-2.5-18.5 LF-MCG/0.5 IM SUSP
0.5000 mL | Freq: Once | INTRAMUSCULAR | Status: AC
  Administered 2013-10-06: 0.5 mL via INTRAMUSCULAR
  Filled 2013-10-06: qty 0.5

## 2013-10-06 MED ORDER — KETAMINE HCL 50 MG/ML IJ SOLN
2.0000 mg/kg | Freq: Once | INTRAMUSCULAR | Status: AC
  Administered 2013-10-06: 145 mg via INTRAMUSCULAR
  Filled 2013-10-06: qty 10

## 2013-10-06 MED ORDER — MIDAZOLAM HCL 2 MG/2ML IJ SOLN
2.0000 mg | Freq: Once | INTRAMUSCULAR | Status: AC
  Administered 2013-10-06: 2 mg via INTRAVENOUS
  Filled 2013-10-06: qty 2

## 2013-10-06 MED ORDER — HYDROMORPHONE HCL PF 1 MG/ML IJ SOLN
1.0000 mg | Freq: Once | INTRAMUSCULAR | Status: AC
  Administered 2013-10-06: 1 mg via INTRAVENOUS
  Filled 2013-10-06: qty 1

## 2013-10-06 MED ORDER — OXYCODONE-ACETAMINOPHEN 5-325 MG PO TABS
2.0000 | ORAL_TABLET | Freq: Once | ORAL | Status: AC
  Administered 2013-10-06: 2 via ORAL

## 2013-10-06 MED ORDER — OXYCODONE-ACETAMINOPHEN 5-325 MG PO TABS: 2.0000 | ORAL_TABLET | ORAL | Status: DC | PRN

## 2013-10-06 MED ORDER — OXYCODONE-ACETAMINOPHEN 5-325 MG PO TABS
ORAL_TABLET | ORAL | Status: AC
  Filled 2013-10-06: qty 2

## 2013-10-06 MED ORDER — SODIUM CHLORIDE 0.9 % IV BOLUS (SEPSIS)
500.0000 mL | Freq: Once | INTRAVENOUS | Status: AC
  Administered 2013-10-06: 16:00:00 via INTRAVENOUS

## 2013-10-06 MED ORDER — ONDANSETRON HCL 4 MG/2ML IJ SOLN
4.0000 mg | Freq: Once | INTRAMUSCULAR | Status: AC
  Administered 2013-10-06: 4 mg via INTRAMUSCULAR
  Filled 2013-10-06: qty 2

## 2013-10-06 MED ORDER — LIDOCAINE HCL (PF) 2 % IJ SOLN
10.0000 mL | Freq: Once | INTRAMUSCULAR | Status: DC
  Filled 2013-10-06: qty 10

## 2013-10-06 MED ORDER — LIDOCAINE-EPINEPHRINE (PF) 2 %-1:200000 IJ SOLN
INTRAMUSCULAR | Status: AC
  Administered 2013-10-06: 20 mL
  Filled 2013-10-06: qty 20

## 2013-10-06 MED ORDER — OXYCODONE-ACETAMINOPHEN 10-325 MG PO TABS: 1.0000 | ORAL_TABLET | ORAL | Status: DC | PRN

## 2013-10-06 NOTE — ED Notes (Signed)
edp back in room with pt

## 2013-10-06 NOTE — ED Notes (Signed)
Pt alert & oriented x4, stable gait. Patient given discharge instructions, paperwork & prescription(s). Patient  instructed to stop at the registration desk to finish any additional paperwork. Patient verbalized understanding. Pt left department w/ no further questions. 

## 2013-10-06 NOTE — ED Notes (Signed)
Patient states she fell down approximately 14 steps and landed on cement. Patient has approximately 1 1/2 laceration on back of head and swelling and deformity noted to right arm and hand. Denies LOC.

## 2013-10-06 NOTE — ED Provider Notes (Signed)
CSN: [id number]     Arrival date & time 10/06/13  1440 History  This chart was scribed for Hilario Quarry, MD by Quintella Reichert, ED scribe.  This patient was seen in room APA10/APA10 and the patient's care was started at 3:21 PM.   Chief Complaint  Patient presents with  . Fall  . Head Injury  . Arm Injury    Patient is a 38 y.o. female presenting with fall. The history is provided by the patient. No language interpreter was used.  Fall This is a new problem. Episode frequency: Occurred one time. The problem has not changed since onset.Associated symptoms include headaches. Associated symptoms comments: Head pain, right arm pain.  No LOC.. She has tried nothing for the symptoms.    HPI Comments: Courtney Ponce is a 38 y.o. female who presents to the Emergency Department complaining of a fall that occurred this morning at 2 AM.  Pt was wearing stilettos while slightly intoxicated when she slipped and fell down approximately 11 steps.  She hit her head on the wall and landed on the cement floor.  She denies LOC.  She did not come to the ED until now due to lack of transportation.  Presently she complains of severe burning pain to her right wrist with some pain to her right hand and elbow, with associated swelling.  She also presents with a laceration to the back of her head and complains of moderate throbbing pain to that area and states her hair was matted with blood earlier.  Pt is right-handed.  She last drank water after arrival to the ED and ate a piece of pizza at around 12 PM.  She states she has not had anything else by mouth since waking this morning.  She denies using any substances other than alcohol last night.  She does not recall her last tetanus vaccination.  She denies possibility of pregnancy because she is on Depo.   Past Medical History  Diagnosis Date  . Anxiety   . Depression   . Abnormal Pap smear   . GERD (gastroesophageal reflux disease)   . Neuromuscular disorder    . Chronic back pain     Past Surgical History  Procedure Laterality Date  . Wisdom tooth extraction    . Cystoscopy with urethral dilatation      age 36  . Colposcopy  2002    Family History  Problem Relation Age of Onset  . Cancer Mother     breast w/metastasis to liver,lung,bone, brain  . Hypertension Sister   . Thyroid disease Sister   . Cancer Maternal Aunt     ovarian w/metastasis to colon    History  Substance Use Topics  . Smoking status: Current Every Day Smoker -- 1.00 packs/day for 22 years    Types: Cigarettes  . Smokeless tobacco: Not on file  . Alcohol Use: Yes     Comment: socially    OB History   Grav Para Term Preterm Abortions TAB SAB Ect Mult Living   1 1 1       1       Review of Systems  Musculoskeletal: Positive for arthralgias (right wrist, hand and elbow).  Skin: Positive for wound (back of head).  Neurological: Positive for headaches. Negative for syncope.  All other systems reviewed and are negative.     Allergies  Shellfish allergy and Wellbutrin  Home Medications   Current Outpatient Rx  Name  Route  Sig  Dispense  Refill  . B Complex-C (SUPER B COMPLEX PO)   Oral   Take 1 tablet by mouth daily.         . diazepam (VALIUM) 10 MG tablet   Oral   Take 10 mg by mouth at bedtime as needed for sleep.          Marland Kitchen. gabapentin (NEURONTIN) 300 MG capsule   Oral   Take 300 mg by mouth 4 (four) times daily.          Marland Kitchen. HYDROcodone-acetaminophen (NORCO/VICODIN) 5-325 MG per tablet   Oral   Take 1 tablet by mouth every 6 (six) hours as needed for pain.   15 tablet   0   . ibuprofen (ADVIL,MOTRIN) 800 MG tablet   Oral   Take 1 tablet (800 mg total) by mouth 3 (three) times daily.   21 tablet   0   . medroxyPROGESTERone (DEPO-PROVERA) 150 MG/ML injection   Intramuscular   Inject 150 mg into the muscle every 3 (three) months.         . Multiple Vitamin (MULTIVITAMIN WITH MINERALS) TABS   Oral   Take 1 tablet by mouth  daily.         . ranitidine (ZANTAC) 150 MG tablet   Oral   Take 150 mg by mouth daily.          . traMADol (ULTRAM) 50 MG tablet   Oral   Take 50 mg by mouth every 8 (eight) hours as needed for pain.           BP 140/101  Pulse 122  Temp(Src) 98.1 F (36.7 C) (Oral)  Resp 18  Ht 5' 3.5" (1.613 m)  Wt 160 lb (72.576 kg)  BMI 27.89 kg/m2  SpO2 100%  Physical Exam  Nursing note and vitals reviewed. Constitutional: She is oriented to person, place, and time. She appears well-developed and well-nourished. No distress.  HENT:  Head: Normocephalic.  Edentulous  6-cm caceration on occiput, with a large amount of dried blood around it  Eyes: EOM are normal.  Neck: Neck supple. No tracheal deviation present.  Cardiovascular: Normal rate.   Pulmonary/Chest: Effort normal. No respiratory distress.  Musculoskeletal:       Right elbow: No tenderness found.       Right wrist: She exhibits tenderness, swelling and deformity.       Cervical back: She exhibits no tenderness and no bony tenderness.       Thoracic back: She exhibits no tenderness and no bony tenderness.       Lumbar back: She exhibits no tenderness and no bony tenderness.  Right upper extremity has swelling and deformity of the right wrist with diffuse tenderness to palpation.  Right elbow nontender.  Fingers have no obvious deformity.  Pain with extension and flexion of all fingers.  2-point discrimination intact.  Neurological: She is alert and oriented to person, place, and time.  Skin: Skin is warm and dry. Abrasion noted.  Abrasions of left knee and left dorsal aspect of foot, appear to be old.  Psychiatric: She has a normal mood and affect. Her behavior is normal.    ED Course  Procedures (including critical care time)  DIAGNOSTIC STUDIES: Oxygen Saturation is 100% on room air, normal by my interpretation.    COORDINATION OF CARE: 3:30 PM-Discussed treatment plan which includes pain medication and imaging  with pt at bedside and pt agreed to plan.   5:39 PM-Informed pt that imaging reveals fracture.  Discussed treatment plan which includes conscious sedation and reduction with pt at bedside and pt agreed to plan.     CONSCIOUS SEDATION AND REDUCTION PROCEDURE NOTE: Patient identification was confirmed, consent was obtained verbally.  This procedure was performed at 5:12 PM by Hilario Quarry, MD Site: Right wrist Sedation agent: 150 mg Ketamine IV    Pre-procedure airway exam:  Mallampati class 1.  Full ROM of neck.  Able to open mouth 3 finger.  Submandibular area greater than 4 cm. She is on the monitor.  Oxygen in place, pulse ox normal.  HR 95.  BP 134/81.   Pre-procedure N/V exam: Intact, CR<2 seconds # of attempts: 1 Type of splint: Sugar tong Pt anesthetized, fx/dislocation reduced successfully.  Patient tolerated procedure well without complications.  Patient splinted.    LACERATION REPAIR Performed by: Hilario Quarry, MD  Consent: Verbal consent obtained. Risks and benefits: risks, benefits and alternatives were discussed Patient identity confirmed: provided demographic data Time out performed prior to procedure Prepped and Draped in normal sterile fashion Wound explored Laceration Location: Occiput Laceration Length: 6 cm No Foreign Bodies seen or palpated Anesthesia: local infiltration Local anesthetic: lidocaine 2% w/ epinephrine Anesthetic total: 4 ml Irrigation method: syringe Amount of cleaning: standard Number of staples: 7 Technique: Staples Patient tolerance: Patient tolerated the procedure well with no immediate complications.    Labs Review Labs Reviewed  CBC - Abnormal; Notable for the following:    WBC 10.8 (*)    Hemoglobin 15.4 (*)    All other components within normal limits  BASIC METABOLIC PANEL - Abnormal; Notable for the following:    Glucose, Bld 107 (*)    All other components within normal limits    Imaging Review Dg Elbow Complete  Right  10/06/2013   CLINICAL DATA:  Larey Seat.  Right elbow pain.  EXAM: RIGHT ELBOW - COMPLETE 3+ VIEW  COMPARISON:  None.  FINDINGS: The joint spaces are maintained. No acute fracture or joint effusion.  IMPRESSION: No acute bony findings.   Electronically Signed   By: Loralie Champagne M.D.   On: 10/06/2013 16:35   Dg Wrist Complete Right  10/06/2013   CLINICAL DATA:  Larey Seat.  Injured right wrist.  EXAM: RIGHT WRIST - COMPLETE 3+ VIEW  COMPARISON:  None.  FINDINGS: There is a dorsally impacted intra-articular distal radius fracture with comminution. There is also a fracture through the base of the ulnar styloid. The carpal bones are intact and the metacarpal bones are normal.  IMPRESSION: Comminuted intra-articular fracture of the distal radius with dorsal impaction.  Ulnar styloid fracture.   Electronically Signed   By: Loralie Champagne M.D.   On: 10/06/2013 16:34   Ct Head Wo Contrast  10/06/2013   CLINICAL DATA:  Larey Seat.  Hit head.  EXAM: CT HEAD WITHOUT CONTRAST  TECHNIQUE: Contiguous axial images were obtained from the base of the skull through the vertex without intravenous contrast.  COMPARISON:  None.  FINDINGS: The ventricles are normal in size and configuration. No extra-axial fluid collections are identified. The gray-white differentiation is normal. No CT findings for acute intracranial process such as hemorrhage or infarction. No mass lesions. The brainstem and cerebellum are grossly normal.  There is a scalp hematoma at the right vertex. No radiopaque foreign body.  The bony structures are intact. The paranasal sinuses and mastoid air cells are clear. The globes are intact.  IMPRESSION: No acute intracranial findings or skull fracture.  Right-sided scalp hematoma at the vertex.  Electronically Signed   By: Loralie Champagne M.D.   On: 10/06/2013 16:22   Dg Hand Complete Right  10/06/2013   CLINICAL DATA:  Larey Seat.  Right wrist pain.  EXAM: RIGHT HAND - COMPLETE 3+ VIEW  COMPARISON:  None.  FINDINGS: There is  a comminuted intra-articular fracture of the distal radius with dorsal impaction. An ulnar styloid fracture is also noted. No hand fractures are identified.  IMPRESSION: Comminuted intra-articular fracture of the distal radius with dorsal impaction.  Ulnar styloid fracture.   Electronically Signed   By: Loralie Champagne M.D.   On: 10/06/2013 16:35    EKG Interpretation   None       MDM  38 year old female who fell last night and has a laceration to her head and a comminuted impacted fracture of her distal right radius. She received conscious sedation and had reduction of her right wrist which continues to be impacted. Her hand remains neurovascularly intact. She had stapling of her head wound and received tetanus update here. I discussed her care with Dr. Romeo Apple and she is given referral to followup with him in the office on Monday.  I personally performed the services described in this documentation, which was scribed in my presence. The recorded information has been reviewed and considered.    Hilario Quarry, MD 10/06/13 [id number]

## 2013-10-06 NOTE — ED Notes (Signed)
Dr. Rosalia Hammersay setting fracture to right wrist.  Applying sugar tong splint.

## 2013-10-06 NOTE — Discharge Instructions (Signed)
Please call Dr. Romeo AppleHarrison for office Monday morning to be seen for evaluation for definitive care of the fracture. Staples should be removed in 5-7 days.   Staple Wound Closure Staples are used to help a wound heal faster by holding the edges of the wound together. HOME CARE  Keep the area around the staples clean and dry.  Rest and raise (elevate) the injured part above the level of your heart.  See your doctor for a follow-up check of the wound.  See your doctor to have the staples removed.  Clean the wound daily with water.  Do not soak the wound in water for long periods of time.  Let air reach the wound as it heals. GET HELP RIGHT AWAY IF:   You have redness or puffiness around the wound.  You have a red line going away from the wound.  You have more pain or tenderness.  You have yellowish-white fluid (pus) coming from the wound.  Your wound does not stay together after the staples have been taken out.  You see something coming out of the wound, such as wood or glass.  You have problems moving the injured area.  You have a fever or lasting symptoms for more than 2-3 days.  You have a fever and your symptoms suddenly get worse. MAKE SURE YOU:   Understand these instructions.  Will watch this condition.  Will get help right away if you are not doing well or get worse. Document Released: 07/01/2008 Document Revised: 06/16/2012 Document Reviewed: 04/04/2012 [facility] Patient Information 2014 [place], [place]. Wrist Fracture A wrist fracture is a break in one of the bones of the wrist. Your wrist is made up of several small bones at the palm of your hand (carpal bones) and the two bones that make up your forearm (radius and ulna). The bones come together to form multiple large and small joints. The shape and design of these joints allow your wrist to bend and straighten, move side-to-side, and rotate, as in twisting your palm up or down. CAUSES  A fracture may occur in  any of the bones in your wrist when enough force is applied to the wrist, such as when falling down onto an outstretched hand. Severe injuries may occur from a more forceful injury. SYMPTOMS Symptoms of wrist fractures include tenderness, bruising, and swelling. Additionally, the wrist may hang in an odd position or may be misshaped. DIAGNOSIS To diagnose a wrist fracture, your caregiver will physically examine your wrist. Your caregiver may also request an X-Axil Copeman exam of your wrist. TREATMENT Treatment depends on many factors, including the nature and location of the fracture, your age, and your activity level. Treatment for wrist fracture can be nonsurgical or surgical. For nonsurgical treatment, a plaster cast or splint may be applied to your wrist if the bone is in a good position (aligned). The cast will stay on for about 6 weeks. If the alignment of your bone is not good, it may be necessary to realign (reduce) it. After the bone is reduced, a splint usually is placed on your wrist to allow for a small amount of normal swelling. After about 1 week, the splint is removed and a cast is added. The cast is removed 2 or 3 weeks later, after the swelling goes down, causing the cast to loosen. Another cast is applied. This cast is removed after about another 2 or 3 weeks, for a total of 4 to 6 weeks of immobilization. Sometimes the position of  the bone is so far out of place that surgery is required to apply a device to hold it together as it heals. If the bone cannot be reduced without cutting the skin around the bone (closed reduction), a cut (incision) is made to allow direct access to the bone to reduce it (open reduction). Depending on the fracture, there are a number of options for holding the bone in place while it heals, including a cast, metal pins, a plate and screws, and a device called an external fixator. With an external fixator, most of the hardware remains outside of the body. HOME CARE  INSTRUCTIONS  To lessen swelling, keep your injured wrist elevated and move your fingers as much as possible.  Apply ice to your wrist for the first 1 to 2 days after you have been treated or as directed by your caregiver. Applying ice helps to reduce inflammation and pain.  Put ice in a plastic bag.  Place a towel between your skin and the bag.  Leave the ice on for 15 to 20 minutes at a time, every 2 hours while you are awake.  Do not put pressure on any part of your cast or splint. It may break.  Use a plastic bag to protect your cast or splint from water while bathing or showering. Do not lower your cast or splint into water.  Only take over-the-counter or prescription medicines for pain as directed by your caregiver. SEEK IMMEDIATE MEDICAL CARE IF:   Your cast or splint gets damaged or breaks.  You have continued severe pain or more swelling than you did before the cast was put on.  Your skin or fingernails below the injury turn blue or gray or feel cold or numb.  You develop decreased feeling in your fingers. MAKE SURE YOU:  Understand these instructions.  Will watch your condition.  Will get help right away if you are not doing well or get worse. Document Released: 07/02/2005 Document Revised: 12/15/2011 Document Reviewed: 10/10/2011 [facility] Patient Information 2014 [place], [place].

## 2013-10-06 NOTE — ED Notes (Signed)
Head stapled up.  Splint applied to right arm.  Good color , temp to fingers .  Good cap refill. Sling applied.

## 2013-10-06 NOTE — ED Notes (Signed)
Pt not happy with ketamine. States kids on the street use that drug. Pt is talking about Jesus one minute, the next she is cursing like a Heritage managersailor.

## 2013-10-06 NOTE — ED Notes (Signed)
Starting to staple head lac.

## 2013-10-10 ENCOUNTER — Encounter: Payer: Self-pay | Admitting: Orthopedic Surgery

## 2013-10-10 ENCOUNTER — Ambulatory Visit (INDEPENDENT_AMBULATORY_CARE_PROVIDER_SITE_OTHER): Payer: BC Managed Care – PPO | Admitting: Orthopedic Surgery

## 2013-10-10 VITALS — BP 126/89 | Ht 63.5 in | Wt 170.0 lb

## 2013-10-10 DIAGNOSIS — S52599A Other fractures of lower end of unspecified radius, initial encounter for closed fracture: Secondary | ICD-10-CM

## 2013-10-10 DIAGNOSIS — S52501A Unspecified fracture of the lower end of right radius, initial encounter for closed fracture: Secondary | ICD-10-CM

## 2013-10-10 DIAGNOSIS — S52509A Unspecified fracture of the lower end of unspecified radius, initial encounter for closed fracture: Secondary | ICD-10-CM | POA: Insufficient documentation

## 2013-10-10 MED ORDER — OXYCODONE-ACETAMINOPHEN 10-325 MG PO TABS: 1.0000 | ORAL_TABLET | ORAL | Status: DC | PRN

## 2013-10-10 NOTE — Progress Notes (Signed)
   Subjective:    Patient ID: Courtney Ponce, female    DOB: 08/28/1976, 38 y.o.   MRN: [id number]  Chief Complaint  Patient presents with  . Wrist Pain    Right wrist fracture d/t injury 10/06/13    Wrist Pain    38 year old female injured her wrist on New Year's Eve came into the ER on January 1 she had a closed reduction of a distal radius fracture which was comminuted and intra-articular. The position was improved. She was placed in a sugar tong splint. She comes in complaining of sharp throbbing 9/10 constant pain and bruising numbness tingling and swelling. Should be noted that she takes Percocet 10/325 mg for chronic pain and takes Valium to sleep at night. Her physician is in [place].  She has mobility issues that she drives a scooter.  She got to the office today by barring a car with her grandmother. Her review of systems she's noted fever and chills and fatigue since her visit to the hospital she also has nausea and vomiting diarrhea. She has a history of excessive thirst. She has normal system review otherwise.  She is allergic to Wellbutrin and shellfish. She has chronic back pain. She lists no surgeries. She lists Neurontin, oxycodone 10 mg, Valium, Ultram for pain medications. Family history of heart disease arthritis cancer and asthma. 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    Review of Systems     Objective:   Physical Exam BP 126/89  Ht 5' 3.5" (1.613 m)  Wt 170 lb (77.111 kg)  BMI 29.64 kg/m2 She is in an emotional state today especially when told she would be out of work for 8 weeks she is oriented to time person and place her mood is anxious her gait is normal her wrist is swollen the fingers are swollen her wrist is held in flexion is painful range of motion with tenderness and swelling over the distal radius there is no gross deformity. The wrist joint is stable by x-ray confirmation motor  exam shows normal muscle tone. She has some blisters on her fingers I think the cast too tight her skin is otherwise intact she is a good pulse normal peripheral vascular perfusion and normal sensation with no lymphadenopathy     Assessment & Plan:  Pre-reduction x-rays elbow x-rays and postreduction x-rays confirm that there is a comminuted distal radius fracture intra-articular but the post reduction film was in acceptable alignment. This can be followed with x-rays serially starting in a week  The wrist and hand are too swollen at this time for circular cast a volar splint is applied.  Encounter Diagnosis  Name Primary?  . Distal radius fracture, right, closed, initial encounter Yes    No orders of the defined types were placed in this encounter.

## 2013-10-10 NOTE — Patient Instructions (Signed)
Keep elevated   OOW note x 2 months   Return in a week for xrays in the splint

## 2013-10-11 ENCOUNTER — Ambulatory Visit: Payer: BC Managed Care – PPO | Admitting: Orthopedic Surgery

## 2013-10-16 ENCOUNTER — Encounter (HOSPITAL_COMMUNITY): Payer: Self-pay | Admitting: Emergency Medicine

## 2013-10-16 ENCOUNTER — Emergency Department (HOSPITAL_COMMUNITY)
Admission: EM | Admit: 2013-10-16 | Discharge: 2013-10-16 | Disposition: A | Discharge status: Home / Self Care | Payer: BC Managed Care – PPO | Attending: Emergency Medicine | Admitting: Emergency Medicine

## 2013-10-16 DIAGNOSIS — F3289 Other specified depressive episodes: Secondary | ICD-10-CM | POA: Insufficient documentation

## 2013-10-16 DIAGNOSIS — F411 Generalized anxiety disorder: Secondary | ICD-10-CM | POA: Insufficient documentation

## 2013-10-16 DIAGNOSIS — G8929 Other chronic pain: Secondary | ICD-10-CM | POA: Insufficient documentation

## 2013-10-16 DIAGNOSIS — M549 Dorsalgia, unspecified: Secondary | ICD-10-CM | POA: Insufficient documentation

## 2013-10-16 DIAGNOSIS — F17200 Nicotine dependence, unspecified, uncomplicated: Secondary | ICD-10-CM | POA: Insufficient documentation

## 2013-10-16 DIAGNOSIS — K089 Disorder of teeth and supporting structures, unspecified: Secondary | ICD-10-CM | POA: Insufficient documentation

## 2013-10-16 DIAGNOSIS — Z4802 Encounter for removal of sutures: Secondary | ICD-10-CM | POA: Insufficient documentation

## 2013-10-16 DIAGNOSIS — F329 Major depressive disorder, single episode, unspecified: Secondary | ICD-10-CM | POA: Insufficient documentation

## 2013-10-16 NOTE — ED Provider Notes (Signed)
Medical screening examination/treatment/procedure(s) were performed by non-physician practitioner and as supervising physician I was immediately available for consultation/collaboration.  EKG Interpretation   None         Joya Gaskinsonald W Ifeanyi Mickelson, MD 10/16/13 1546

## 2013-10-16 NOTE — ED Notes (Signed)
Pt presents to er for removal of staples from head area, denies any problems with the staples or site of injury.

## 2013-10-16 NOTE — Discharge Instructions (Signed)
  Staple Removal Care After The staples used to close your skin have been removed. The wound needs continued care so it can heal completely and without problems. The care described here will need to be done for another 5-10 days unless your caregiver advises otherwise.  HOME CARE INSTRUCTIONS   Keep wound site dry and clean.  If skin adhesive strips were applied after the staples were removed, they will begin to peel off in a few days. If they remain after fourteen days, they may be peeled off and discarded.  If you still have a dressing, change it at least once a day or as instructed by your caregiver. If the bandage sticks, soak it off with warm water. Pat dry with a clean towel. Look for signs of infection (see below).  Reapply cream or ointment according to your caregiver's instruction. This will help prevent infection and keep the bandage from sticking. Use of a non-stick material over the wound and under the dressing or wrap will also help keep the bandage from sticking.  If the bandage becomes wet, dirty or develops a foul smell, change it as soon as possible.  New scars become sunburned easily. Use sunscreens with protection factor (SPF) of at least 15 when out in the sun.  Only take over-the-counter or prescription medicines for pain, discomfort or fever as directed by your caregiver. SEEK IMMEDIATE MEDICAL CARE IF:   There is redness, swelling or increasing pain in the wound.  Pus is coming from the wound.  An unexplained oral temperature above 102 F (38.9 C) develops.  You notice a foul smell coming from the wound or dressing.  There is a breaking open of the suture line (edges not staying together) of the wound edges after staples have been removed. Document Released: 09/04/2008 Document Revised: 12/15/2011 Document Reviewed: 09/04/2008 [employer] Patient Information 2014 [employer].  

## 2013-10-16 NOTE — ED Provider Notes (Signed)
CSN: [id number]     Arrival date & time 10/16/13  1452 History   First MD Initiated Contact with Patient 10/16/13 1510     Chief Complaint  Patient presents with  . Suture / Staple Removal   (Consider location/radiation/quality/duration/timing/severity/associated sxs/prior Treatment) HPI Comments: The patient is a 38 year old female who presents to the emergency department for staple removal from the scalp area. The patient sustained a fall on 10/06/2013. She required a staple repair. Since that time she has not had any drainage or bleeding. She's not had any high fever to report. Patient presents now for suture removal.  Patient is a 38 y.o. female presenting with suture removal. The history is provided by the patient.  Suture / Staple Removal Pertinent negatives include no abdominal pain, arthralgias, chest pain, coughing or neck pain.    Past Medical History  Diagnosis Date  . Anxiety   . Depression   . Abnormal Pap smear   . GERD (gastroesophageal reflux disease)   . Neuromuscular disorder   . Chronic back pain    Past Surgical History  Procedure Laterality Date  . Wisdom tooth extraction    . Cystoscopy with urethral dilatation      age 49  . Colposcopy  2002   Family History  Problem Relation Age of Onset  . Cancer Mother     breast w/metastasis to liver,lung,bone, brain  . Hypertension Sister   . Thyroid disease Sister   . Cancer Maternal Aunt     ovarian w/metastasis to colon   History  Substance Use Topics  . Smoking status: Current Every Day Smoker -- 1.00 packs/day for 22 years    Types: Cigarettes  . Smokeless tobacco: Not on file  . Alcohol Use: Yes     Comment: socially   OB History   Grav Para Term Preterm Abortions TAB SAB Ect Mult Living   1 1 1       1      Review of Systems  Constitutional: Negative for activity change.       All ROS Neg except as noted in HPI  HENT: Positive for dental problem. Negative for nosebleeds.   Eyes: Negative for  photophobia and discharge.  Respiratory: Negative for cough, shortness of breath and wheezing.   Cardiovascular: Negative for chest pain and palpitations.  Gastrointestinal: Negative for abdominal pain and blood in stool.  Genitourinary: Negative for dysuria, frequency and hematuria.  Musculoskeletal: Positive for back pain. Negative for arthralgias and neck pain.  Skin: Negative.   Neurological: Negative for dizziness, seizures and speech difficulty.  Psychiatric/Behavioral: Negative for hallucinations and confusion. The patient is nervous/anxious.     Allergies  Shellfish allergy and Wellbutrin  Home Medications   Current Outpatient Rx  Name  Route  Sig  Dispense  Refill  . diazepam (VALIUM) 10 MG tablet   Oral   Take 10 mg by mouth daily as needed for sleep.          . naproxen sodium (ALEVE) 220 MG tablet   Oral   Take 880 mg by mouth 2 (two) times daily as needed (Pain).         . ondansetron (ZOFRAN ODT) 8 MG disintegrating tablet   Oral   Take 1 tablet (8 mg total) by mouth every 8 (eight) hours as needed for nausea or vomiting.   20 tablet   0   . oxyCODONE-acetaminophen (PERCOCET) 10-325 MG per tablet   Oral   Take 1 tablet by mouth  every 4 (four) hours as needed for pain.   42 tablet   0   . oxyCODONE-acetaminophen (PERCOCET/ROXICET) 5-325 MG per tablet   Oral   Take 2 tablets by mouth every 4 (four) hours as needed for severe pain.   6 tablet   0    BP 132/97  Pulse 77  Temp(Src) 98 F (36.7 C) (Oral)  Resp 20  Ht 5\' 3"  (1.6 m)  Wt 160 lb (72.576 kg)  BMI 28.35 kg/m2  SpO2 100% Physical Exam  Nursing note and vitals reviewed. Constitutional: She is oriented to person, place, and time. She appears well-developed and well-nourished.  Non-toxic appearance.  HENT:  Right Ear: Tympanic membrane and external ear normal.  Left Ear: Tympanic membrane and external ear normal.  Patient has staples in place of the scalp in the occipital area. There is  no drainage. There is no red streaking. There is no satellite abscess.   Eyes: EOM and lids are normal. Pupils are equal, round, and reactive to light.  Neck: Normal range of motion. Neck supple. Carotid bruit is not present.  Cardiovascular: Normal rate, regular rhythm, normal heart sounds, intact distal pulses and normal pulses.   Pulmonary/Chest: Breath sounds normal. No respiratory distress.  Abdominal: Soft. Bowel sounds are normal. There is no tenderness. There is no guarding.  Musculoskeletal: Normal range of motion.  Lymphadenopathy:       Head (right side): No submandibular adenopathy present.       Head (left side): No submandibular adenopathy present.    She has no cervical adenopathy.  Neurological: She is alert and oriented to person, place, and time. She has normal strength. No cranial nerve deficit or sensory deficit.  Skin: Skin is warm and dry.  Psychiatric: She has a normal mood and affect. Her speech is normal.    ED Course  Procedures (including critical care time) Labs Review Labs Reviewed - No data to display Imaging Review No results found.  EKG Interpretation   None       MDM   1. Encounter for staple removal    **I have reviewed nursing notes, vital signs, and all appropriate lab and imaging results for this patient.*  Patient had a laceration of the scalp that was repaired with staples on January 1. She returned today for staple removal and wound check. The wound is healing nicely and progressing nicely. Staples removed without problem. Patient invited to return if any changes, problems, or concerns.  Kathie DikeHobson M Saxon Crosby, PA-C 10/16/13 1521

## 2013-10-17 ENCOUNTER — Ambulatory Visit (INDEPENDENT_AMBULATORY_CARE_PROVIDER_SITE_OTHER): Payer: BC Managed Care – PPO

## 2013-10-17 ENCOUNTER — Ambulatory Visit (INDEPENDENT_AMBULATORY_CARE_PROVIDER_SITE_OTHER): Payer: BC Managed Care – PPO | Admitting: Orthopedic Surgery

## 2013-10-17 ENCOUNTER — Encounter: Payer: Self-pay | Admitting: Orthopedic Surgery

## 2013-10-17 VITALS — BP 129/91 | Ht 63.5 in | Wt 170.0 lb

## 2013-10-17 DIAGNOSIS — IMO0001 Reserved for inherently not codable concepts without codable children: Secondary | ICD-10-CM

## 2013-10-17 DIAGNOSIS — S62101A Fracture of unspecified carpal bone, right wrist, initial encounter for closed fracture: Secondary | ICD-10-CM | POA: Insufficient documentation

## 2013-10-17 DIAGNOSIS — S52599A Other fractures of lower end of unspecified radius, initial encounter for closed fracture: Secondary | ICD-10-CM

## 2013-10-17 DIAGNOSIS — S52501A Unspecified fracture of the lower end of right radius, initial encounter for closed fracture: Secondary | ICD-10-CM

## 2013-10-17 DIAGNOSIS — S62101D Fracture of unspecified carpal bone, right wrist, subsequent encounter for fracture with routine healing: Secondary | ICD-10-CM

## 2013-10-17 MED ORDER — OXYCODONE-ACETAMINOPHEN 10-325 MG PO TABS: 1.0000 | ORAL_TABLET | ORAL | Status: DC | PRN

## 2013-10-17 NOTE — Patient Instructions (Signed)
Keep  Cast dry   Do not get wet   If it gets wet dry with a hair dryer on low setting and call the office   

## 2013-10-17 NOTE — Progress Notes (Signed)
Patient ID: Rubin PayorMindy S Gressman, female   DOB: 01/13/1976, 38 y.o.   MRN: [id number] Chief Complaint  Patient presents with  . Follow-up    one week recheck right wrist fracture DOI 10/06/13    Patient swollen last week to place a circular cast was placed in a splint she comes back for x-ray and possible cast. The swelling has decreased.  Her x-ray shows an intra-articular comminuted fracture of the right distal radius with no major displacement.  short arm cast applied. Followup in 5 weeks x-ray out of plaster possible emergent to wrist trace

## 2013-10-18 MED FILL — Oxycodone w/ Acetaminophen Tab 5-325 MG: ORAL | Qty: 6 | Status: AC

## 2013-10-24 ENCOUNTER — Telehealth: Payer: Self-pay | Admitting: Orthopedic Surgery

## 2013-10-24 NOTE — Telephone Encounter (Signed)
Routing to Dr Harrison 

## 2013-10-24 NOTE — Telephone Encounter (Signed)
Patient called, requesting "1 more week of refill on pain medication":  oxyCODONE-acetaminophen (PERCOCET) 10-325 MG per tablet - her ph# is [phone]. Her next scheduled appointment is 11/22/13.

## 2013-10-25 ENCOUNTER — Other Ambulatory Visit: Payer: Self-pay | Admitting: Orthopedic Surgery

## 2013-10-25 MED ORDER — OXYCODONE-ACETAMINOPHEN 10-325 MG PO TABS: 1.0000 | ORAL_TABLET | ORAL | Status: DC | PRN

## 2013-10-25 NOTE — Telephone Encounter (Signed)
Patient has been called to pick up prescription.

## 2013-10-31 ENCOUNTER — Telehealth: Payer: Self-pay | Admitting: Orthopedic Surgery

## 2013-10-31 NOTE — Telephone Encounter (Signed)
Routing to Dr Harrison 

## 2013-10-31 NOTE — Telephone Encounter (Signed)
Patient asked for another prescription for Percocet 10/325 for a week

## 2013-11-01 ENCOUNTER — Other Ambulatory Visit: Payer: Self-pay | Admitting: Orthopedic Surgery

## 2013-11-01 MED ORDER — OXYCODONE-ACETAMINOPHEN 10-325 MG PO TABS: 1.0000 | ORAL_TABLET | ORAL | Status: DC | PRN

## 2013-11-01 NOTE — Telephone Encounter (Signed)
Left message prescription was ready to be picked up. 

## 2013-11-02 NOTE — Telephone Encounter (Signed)
Prescription picked up by the patient °

## 2013-11-14 ENCOUNTER — Telehealth: Payer: Self-pay | Admitting: Orthopedic Surgery

## 2013-11-14 NOTE — Telephone Encounter (Signed)
Patient called to request refill on pain medication - oxyCODONE-acetaminophen (PERCOCET) 10-325 MG per tablet.  Her next scheduled appointment is 11/22/13 for re-check/Xrays. Her ph# is [phone].

## 2013-11-14 NOTE — Telephone Encounter (Signed)
Routing to Dr Harrison 

## 2013-11-14 NOTE — Telephone Encounter (Signed)
Decrease medication to 7.5 mg

## 2013-11-15 ENCOUNTER — Other Ambulatory Visit: Payer: Self-pay | Admitting: *Deleted

## 2013-11-15 DIAGNOSIS — S62101D Fracture of unspecified carpal bone, right wrist, subsequent encounter for fracture with routine healing: Secondary | ICD-10-CM

## 2013-11-15 MED ORDER — OXYCODONE-ACETAMINOPHEN 7.5-325 MG PO TABS: 1.0000 | ORAL_TABLET | ORAL | Status: DC | PRN

## 2013-11-15 NOTE — Telephone Encounter (Signed)
Patient notified, pick up prescription today, 11/15/13

## 2013-11-22 ENCOUNTER — Ambulatory Visit: Payer: BC Managed Care – PPO | Admitting: Orthopedic Surgery

## 2013-11-24 ENCOUNTER — Ambulatory Visit (INDEPENDENT_AMBULATORY_CARE_PROVIDER_SITE_OTHER): Payer: BC Managed Care – PPO

## 2013-11-24 ENCOUNTER — Encounter: Payer: Self-pay | Admitting: Orthopedic Surgery

## 2013-11-24 ENCOUNTER — Ambulatory Visit (INDEPENDENT_AMBULATORY_CARE_PROVIDER_SITE_OTHER): Payer: BC Managed Care – PPO | Admitting: Orthopedic Surgery

## 2013-11-24 DIAGNOSIS — S62109A Fracture of unspecified carpal bone, unspecified wrist, initial encounter for closed fracture: Secondary | ICD-10-CM

## 2013-11-24 DIAGNOSIS — S62101A Fracture of unspecified carpal bone, right wrist, initial encounter for closed fracture: Secondary | ICD-10-CM | POA: Insufficient documentation

## 2013-11-24 MED ORDER — OXYCODONE-ACETAMINOPHEN 5-325 MG PO TABS: 1.0000 | ORAL_TABLET | ORAL | Status: DC | PRN

## 2013-11-24 NOTE — Progress Notes (Signed)
Patient ID: Courtney Ponce, female   DOB: 09/18/1976, 38 y.o.   MRN: [id number]  Chief Complaint  Patient presents with  . Follow-up    5 week recheck on right wrist fracture with xray.   date of injury 10/06/2013  There were no vitals taken for this visit.  Encounter Diagnosis  Name Primary?  . Wrist fracture, right Yes    Vital signs patient's height is 5 foot 3-1/2 inches weight is 170 pounds  The patient's x-ray shows dorsal tilt slight shortening but accepted radial inclination angle. She has a fractured ulnar styloid.  She seems to focus on this ulnar styloid fracture. I showed her on x-ray and told her that his is normally not a problem  I also told her we normally don't treat that.  She does have swelling and stiffness. Her pain tolerance as low she still requires quite a bit of medication for this fracture. There is some comminution to the fracture which may be contributing to her slow recovery  We recommend that she undergo occupational therapy and worse splint  The injury was 10/06/2013 this is post fracture day #49/week #7.

## 2013-11-24 NOTE — Patient Instructions (Addendum)
[facility]  Wear brace x 6 weeks

## 2013-12-07 ENCOUNTER — Ambulatory Visit (HOSPITAL_COMMUNITY)
Admission: RE | Admit: 2013-12-07 | Discharge: 2013-12-07 | Disposition: A | Discharge status: Home / Self Care | Payer: BC Managed Care – PPO | Source: Ambulatory Visit | Attending: Orthopedic Surgery | Admitting: Orthopedic Surgery

## 2013-12-07 DIAGNOSIS — M25539 Pain in unspecified wrist: Secondary | ICD-10-CM | POA: Insufficient documentation

## 2013-12-07 DIAGNOSIS — R279 Unspecified lack of coordination: Secondary | ICD-10-CM | POA: Insufficient documentation

## 2013-12-07 DIAGNOSIS — IMO0001 Reserved for inherently not codable concepts without codable children: Secondary | ICD-10-CM | POA: Insufficient documentation

## 2013-12-07 DIAGNOSIS — M6281 Muscle weakness (generalized): Secondary | ICD-10-CM

## 2013-12-07 NOTE — Evaluation (Signed)
Occupational Therapy Evaluation  Patient Details  Name: Courtney Ponce MRN: [id number] Date of Birth: 02/14/1976  Today's Date: 12/07/2013 Time: [id number] OT Time Calculation (min): 40 min OT eval [id number] 40'  Visit#: 1 of 16  Re-eval: 01/04/14  Assessment Diagnosis: s/p Right wrist fx Next MD Visit: 01/05/14 - Romeo AppleHarrison  Authorization:    Authorization Time Period:    Authorization Visit#:   of     Past Medical History:  Past Medical History  Diagnosis Date  . Anxiety   . Depression   . Abnormal Pap smear   . GERD (gastroesophageal reflux disease)   . Neuromuscular disorder   . Chronic back pain    Past Surgical History:  Past Surgical History  Procedure Laterality Date  . Wisdom tooth extraction    . Cystoscopy with urethral dilatation      age 89  . Colposcopy  2002    Subjective Symptoms/Limitations Symptoms: S: I fell at my sister's house. The stair are so steep. Pertinent History: On 10-06-13 patient fell at sister's house walking down her steep steps. (Approx. 6 steps). Patient did not require surgery and was placed in a cast. Cast was removed and patient presents to occupational therapy evaluation in a prefabricated splint. Pt is to wear splint 24/7. Dr. Romeo AppleHarrison has referred patient to occupational therapy for evaluation and treatment.  Limitations: Can't lift anything heavy. Able to lift computer bag. A little difficult with driving scooter (gas) Uses fingers. Writing. Cutting food.  Patient Stated Goals: To be able to return to work and perform required duties.  Pain Assessment Currently in Pain?: No/denies  Precautions/Restrictions  Precautions Precautions:  (No lifting or weightbearing.) Restrictions Weight Bearing Restrictions: Yes RUE Weight Bearing: Non weight bearing  Balance Screening Balance Screen Has the patient fallen in the past 6 months: Yes How many times?: 1 Has the patient had a decrease in activity level because of a fear of falling? :  No Is the patient reluctant to leave their home because of a fear of falling? : No  Prior Functioning  Home Living Family/patient expects to be discharged to:: Private residence Living Arrangements: Non-relatives/Friends Available Help at Discharge:  (sister) Prior Function Level of Independence: Independent with basic ADLs;Independent with gait  Able to Take Stairs?: Yes Driving: Yes Vocation: Full time employment Vocation Requirements: waitress at [employer]  Assessment ADL/Vision/Perception ADL Eating/Feeding:  (Unable to cut food that isn't soft. ) ADL Comments: Patient has slight diffiiculty completing daily grooming, dressing, and bathing tasks. They have become easier than before. Sister helps when needed.  Dominant Hand: Right  Cognition/Observation Cognition Overall Cognitive Status: Within Functional Limits for tasks assessed Arousal/Alertness: Awake/alert Orientation Level: Oriented X4 Observation/Other Assessments Observations: Increased edema in right  dorsal forearm and wrist region.  Sensation/Coordination/Edema Sensation Light Touch: Appears Intact Coordination Gross Motor Movements are Fluid and Coordinated: Yes Fine Motor Movements are Fluid and Coordinated: Yes 9 Hole Peg Test: R: 18.6" L: 17.4" Edema Edema: R: Elbow 6cm, forearm 4cm, wrist 7 cm. L: elbow 6cm, forearm 4cm, wrist 6cm  Additional Assessments RUE Assessment RUE Assessment:  (assessed seated) RUE AROM (degrees) Right Forearm Pronation: 80 Degrees Right Forearm Supination: 52 Degrees Right Wrist Extension: 10 Degrees Right Wrist Flexion: 22 Degrees Right Wrist Radial Deviation: 10 Degrees Right Wrist Ulnar Deviation: 10 Degrees Right Composite Finger Extension:  (100%) Right Composite Finger Flexion: 75% Right Thumb Opposition:  (to all digits) RUE Strength RUE Overall Strength Comments: not tested due to recent injury Grip (  lbs): 25 Lateral Pinch: 13 lbs 3 Point Pinch: 10  lbs LUE AROM (degrees) Left Forearm Pronation: 90 Degrees Left Forearm Supination: 90 Degrees Left Wrist Extension: 60 Degrees Left Wrist Flexion: 70 Degrees Left Wrist Radial Deviation: 24 Degrees Left Wrist Ulnar Deviation: 28 Degrees Left Composite Finger Extension:  (100%) Left Composite Finger Flexion:  (100%) Left Thumb Opposition:  (too all digits) LUE Strength LUE Overall Strength: Within Functional Limits for tasks assessed Grip (lbs): 70 Lateral Pinch: 19 lbs 3 Point Pinch: 24 lbs Palpation Palpation: Max fascial restrictions in right wrist and forearm region.     Occupational Therapy Assessment and Plan OT Assessment and Plan Clinical Impression Statement: A:  Patient presents with increased pain and fascial restrictions and decreased mobility, strength, and coordination in her right wrist s/p Wrist Fracture.  These deficits are causing decreased independence with all B/IADLs and leisure actiivites.   Pt will benefit from skilled therapeutic intervention in order to improve on the following deficits: Impaired UE functional use;Increased edema;Increased fascial restricitons;Decreased range of motion;Decreased coordination;Decreased strength;Pain Rehab Potential: Excellent OT Frequency: Min 2X/week OT Duration: 8 weeks OT Treatment/Interventions: Self-care/ADL training;Therapeutic activities;Therapeutic exercise;Patient/family education;Manual therapy;Modalities;Splinting OT Plan: P:  Skilled OT intervention to decrease pain and fascial restrictions and improve pain free mobility, strength, coordination needed to return to prior level of independence with all BIADLs and leisure actiivties.  Treatment Plan:  MFR and manual stretching to right flexor and extensor forearm, wrist, and hand PROM, AROM, AAROM to right foream and wrist, functional hand activities, fine motor coordination training, decrease edema, and work hardening activities. .     Goals Short Term Goals Time to  Complete Short Term Goals: 4 weeks Short Term Goal 1: Patient will be educated on a HEP. Short Term Goal 2: Patient will improve right forearm and wrist PROM to [facility] for increased ability to complete B/IADLs.    Short Term Goal 3: Patient will improve right forearm and wrist strength to 3+/5 for increased ability to pick up her trays at work. Short Term Goal 4: Patient will increase right grip strength to 35# and pinch strength to 9# to increase independence with opening containers.   Short Term Goal 5: Patient will decrease fascial restrictions to minimal in right wrist and forearm.   Additional Short Term Goals?: Yes Short Term Goal 6: Patient will decrease pain to 3/10 in right wrist and forearm during functional tasks. Long Term Goals Time to Complete Long Term Goals: 8 weeks Long Term Goal 1: Patient will return to prior level of independence with all B/IADLs, work, leisure.  Long Term Goal 2: Patient will improve right forearm and wrist AROM to [facility] for increased ability to complete B/IADLs.    Long Term Goal 3: Patient will increase right grip strength to 50# and pinch strength to 15# to increase independence with opening containers. Long Term Goal 4: Patient will decrease pain to 1/10 in right wrist and forearm when completing functional tasks.  Long Term Goal 5: Patient will improve right forearm and wrist strength to 4+/5 for increased ability to pick up her trays at work and pass out plates of food. Additional Long Term Goals?: Yes Long Term Goal 6: Patient will decrease fascial restrictions to trace in right wrist and forearm.   Problem List Patient Active Problem List   Diagnosis Date Noted  . Muscle weakness (generalized) 12/07/2013  . Lack of coordination 12/07/2013  . Wrist fracture, right 11/24/2013  . Right wrist fracture 10/17/2013  . Distal  radius fracture 10/10/2013    End of Session Activity Tolerance: Patient tolerated treatment well General Behavior During  Therapy: [facility] for tasks assessed/performed OT Plan of Care OT Home Exercise Plan: Contrast bath, AROM finger and wrist OT Patient Instructions: Handout (scanned) Consulted and Agree with Plan of Care: Patient   Limmie Patricia, OTR/L,CBIS   12/07/2013, 4:45 PM  Physician Documentation Your signature is required to indicate approval of the treatment plan as stated above.  Please sign and either send electronically or make a copy of this report for your files and return this physician signed original.  Please mark one 1.__approve of plan  2. ___approve of plan with the following conditions.   ______________________________                                                          _____________________ Physician Signature                                                                                                             Date

## 2013-12-13 ENCOUNTER — Other Ambulatory Visit: Payer: Self-pay | Admitting: *Deleted

## 2013-12-13 ENCOUNTER — Telehealth: Payer: Self-pay | Admitting: Orthopedic Surgery

## 2013-12-13 DIAGNOSIS — S62101A Fracture of unspecified carpal bone, right wrist, initial encounter for closed fracture: Secondary | ICD-10-CM

## 2013-12-13 MED ORDER — ACETAMINOPHEN-CODEINE #3 300-30 MG PO TABS: 1.0000 | ORAL_TABLET | ORAL | Status: DC | PRN

## 2013-12-13 NOTE — Telephone Encounter (Signed)
Med change  Tylenol 3 1 q 4 prn pain # 42 no refills   No exceptions  No appt needed   If "allegic" tramadol 50 Q 4 prn pain

## 2013-12-13 NOTE — Telephone Encounter (Signed)
Routing to Dr Harrison 

## 2013-12-13 NOTE — Telephone Encounter (Signed)
Informed patient of medication change, and that it is ready to be picked up.

## 2013-12-14 ENCOUNTER — Ambulatory Visit (HOSPITAL_COMMUNITY)
Admission: RE | Admit: 2013-12-14 | Discharge: 2013-12-14 | Disposition: A | Discharge status: Home / Self Care | Payer: BC Managed Care – PPO | Source: Ambulatory Visit | Attending: Orthopedic Surgery | Admitting: Orthopedic Surgery

## 2013-12-14 NOTE — Progress Notes (Signed)
Occupational Therapy Treatment Patient Details  Name: Courtney PayorMindy S Heffington MRN: [id number] Date of Birth: 03/23/1976  Today's Date: 12/14/2013 Time: 1520-1601 OT Time Calculation (min): 41 min MFR 1520-1530 10' Therex [id number] 15' Theract [id number] 16'  Visit#: 2 of 16  Re-eval: 01/04/14    Authorization:    Authorization Time Period:    Authorization Visit#:   of    Subjective Symptoms/Limitations Symptoms: S: i think I slept on it wrong.  Pain Assessment Currently in Pain?: Yes Pain Score: 4  Pain Location: Wrist Pain Orientation: Right Pain Type: Acute pain  Precautions/Restrictions  Precautions Precautions: Other (comment) (no lifting or weightlfting)  Exercise/Treatments Wrist Exercises Forearm Supination: PROM;AROM;10 reps Forearm Pronation: PROM;AROM;10 reps Wrist Flexion: PROM;AROM;10 reps Wrist Extension: PROM;AROM;10 reps Wrist Radial Deviation: PROM;AROM;10 reps Wrist Ulnar Deviation: PROM;AROM;10 reps Other wrist exercises: velcro board, positioned vertically and horizontally 1x up and down with increased time to simulate driving scooter and using gas handle.    Hand Exercises Thumb Opposition: 10X Sponges: 14,17,18 Other Hand Exercises: Resistive clothespins; all colors on/off to increase 3-point pinch strength.      Manual Therapy Manual Therapy: Myofascial release Myofascial Release: MFR and manual stretching to RUE wrist and forearm region to decrease fascial restrictions and increase joint mobility in a pain free zone.   Occupational Therapy Assessment and Plan OT Assessment and Plan Clinical Impression Statement: A: Decreased edema this date as patient has been completing contrast bath at home. Continues to have limited joint mobility due to pain and muscle tightness. Very limited with supination and wrist extension. Required verbali cues for proper body posture and to eliminate substitution movements during exercises and activities.  OT Plan: P:  Cont. to work on increasing AROM of wrist and forearm when completing functional tasks. grip and pinch strength activities   Goals Short Term Goals Time to Complete Short Term Goals: 4 weeks Short Term Goal 1: Patient will be educated on a HEP. Short Term Goal 1 Progress: Progressing toward goal Short Term Goal 2: Patient will improve right forearm and wrist [facility] to [facility] for increased ability to complete B/IADLs.    Short Term Goal 2 Progress: Progressing toward goal Short Term Goal 3: Patient will improve right forearm and wrist strength to 3+/5 for increased ability to pick up her trays at work. Short Term Goal 3 Progress: Progressing toward goal Short Term Goal 4: Patient will increase right grip strength to 35# and pinch strength to 9# to increase independence with opening containers.   Short Term Goal 4 Progress: Progressing toward goal Short Term Goal 5: Patient will decrease fascial restrictions to minimal in right wrist and forearm.   Short Term Goal 5 Progress: Progressing toward goal Additional Short Term Goals?: Yes Short Term Goal 6: Patient will decrease pain to 3/10 in right wrist and forearm during functional tasks. Short Term Goal 6 Progress: Progressing toward goal Long Term Goals Time to Complete Long Term Goals: 8 weeks Long Term Goal 1: Patient will return to prior level of independence with all B/IADLs, work, leisure.  Long Term Goal 1 Progress: Progressing toward goal Long Term Goal 2: Patient will improve right forearm and wrist AROM to [facility] for increased ability to complete B/IADLs.    Long Term Goal 2 Progress: Progressing toward goal Long Term Goal 3: Patient will increase right grip strength to 50# and pinch strength to 15# to increase independence with opening containers. Long Term Goal 3 Progress: Progressing toward goal Long Term Goal 4: Patient will  decrease pain to 1/10 in right wrist and forearm when completing functional tasks.  Long Term Goal 4 Progress:  Progressing toward goal Long Term Goal 5: Patient will improve right forearm and wrist strength to 4+/5 for increased ability to pick up her trays at work and pass out plates of food. Long Term Goal 5 Progress: Progressing toward goal Additional Long Term Goals?: Yes Long Term Goal 6: Patient will decrease fascial restrictions to trace in right wrist and forearm.  Long Term Goal 6 Progress: Progressing toward goal  Problem List Patient Active Problem List   Diagnosis Date Noted  . Muscle weakness (generalized) 12/07/2013  . Lack of coordination 12/07/2013  . Wrist fracture, right 11/24/2013  . Right wrist fracture 10/17/2013  . Distal radius fracture 10/10/2013    End of Session Activity Tolerance: Patient tolerated treatment well General Behavior During Therapy: [facility] for tasks assessed/performed   Limmie Patricia, OTR/L,CBIS   12/14/2013, 4:20 PM

## 2013-12-16 ENCOUNTER — Ambulatory Visit (HOSPITAL_COMMUNITY): Payer: BC Managed Care – PPO

## 2013-12-21 ENCOUNTER — Ambulatory Visit (HOSPITAL_COMMUNITY): Payer: BC Managed Care – PPO | Admitting: Specialist

## 2013-12-22 ENCOUNTER — Inpatient Hospital Stay (HOSPITAL_COMMUNITY): Admission: RE | Admit: 2013-12-22 | Payer: BC Managed Care – PPO | Source: Ambulatory Visit

## 2013-12-22 ENCOUNTER — Telehealth (HOSPITAL_COMMUNITY): Payer: Self-pay

## 2013-12-27 ENCOUNTER — Ambulatory Visit (HOSPITAL_COMMUNITY)
Admission: RE | Admit: 2013-12-27 | Discharge: 2013-12-27 | Disposition: A | Discharge status: Home / Self Care | Payer: BC Managed Care – PPO | Source: Ambulatory Visit | Attending: Internal Medicine | Admitting: Internal Medicine

## 2013-12-27 NOTE — Progress Notes (Signed)
Occupational Therapy Treatment Patient Details  Name: Courtney Ponce MRN: [id number] Date of Birth: 1976-08-23  Today's Date: 12/27/2013 Time: 1525-1600 OT Time Calculation (min): 35 min MFR 4098-1191 10' Therex 1535-1600 25'  Visit#: 3 of 16  Re-eval: 01/04/14    Authorization:    Authorization Time Period:    Authorization Visit#:   of    Subjective Symptoms/Limitations Symptoms: S: I was sick all last week so I coudn't make my appoiintments. I stayed on the couch the entire time.  Pain Assessment Currently in Pain?: Yes Pain Score: 5  Pain Location: Wrist Pain Orientation: Right Pain Type: Acute pain  Precautions/Restrictions  Precautions Precautions: Other (comment) (no lifting or weightlifting)  Exercise/Treatments Wrist Exercises Forearm Supination: PROM;AROM;10 reps Forearm Pronation: PROM;AROM;10 reps Wrist Flexion: PROM;AROM;10 reps Wrist Extension: PROM;AROM;10 reps Wrist Radial Deviation: PROM;AROM;10 reps Wrist Ulnar Deviation: PROM;AROM;10 reps   Theraputty: Roll;Grip;Pinch Theraputty - Roll: yellow  Theraputty - Grip: yellow - supinated and pronated Theraputty - Pinch: yellow   Hand Exercises Thumb Opposition: 10X     Manual Therapy Manual Therapy: Myofascial release Myofascial Release: MFR and manual stretching to RUE wrist and forearm region to decrease fascial restrictions and increase joint mobility in a pain free zone.   Occupational Therapy Assessment and Plan OT Assessment and Plan Clinical Impression Statement: A: Significant decrease in edema this date. Limited joint mobility during supination and wrist flexion and extension. Patient only required min vc's for proper body posture during exercises and activities. Patient given yellow theraputty for use at home to increase grip strength. Patient to see Dr. Romeo Apple 4/2 /15. OT Plan: P: Cont. to work on increasing AROM of wrist and forearm when completing functional tasks with less  substitution movements. grip and pinch strength activities   Goals Short Term Goals Time to Complete Short Term Goals: 4 weeks Short Term Goal 1: Patient will be educated on a HEP. Short Term Goal 1 Progress: Progressing toward goal Short Term Goal 2: Patient will improve right forearm and wrist PROM to [facility] for increased ability to complete B/IADLs.    Short Term Goal 2 Progress: Progressing toward goal Short Term Goal 3: Patient will improve right forearm and wrist strength to 3+/5 for increased ability to pick up her trays at work. Short Term Goal 3 Progress: Progressing toward goal Short Term Goal 4: Patient will increase right grip strength to 35# and pinch strength to 9# to increase independence with opening containers.   Short Term Goal 4 Progress: Progressing toward goal Short Term Goal 5: Patient will decrease fascial restrictions to minimal in right wrist and forearm.   Short Term Goal 5 Progress: Progressing toward goal Additional Short Term Goals?: Yes Short Term Goal 6: Patient will decrease pain to 3/10 in right wrist and forearm during functional tasks. Short Term Goal 6 Progress: Progressing toward goal Long Term Goals Time to Complete Long Term Goals: 8 weeks Long Term Goal 1: Patient will return to prior level of independence with all B/IADLs, work, leisure.  Long Term Goal 1 Progress: Progressing toward goal Long Term Goal 2: Patient will improve right forearm and wrist AROM to [facility] for increased ability to complete B/IADLs.    Long Term Goal 2 Progress: Progressing toward goal Long Term Goal 3: Patient will increase right grip strength to 50# and pinch strength to 15# to increase independence with opening containers. Long Term Goal 3 Progress: Progressing toward goal Long Term Goal 4: Patient will decrease pain to 1/10 in right wrist  and forearm when completing functional tasks.  Long Term Goal 4 Progress: Progressing toward goal Long Term Goal 5: Patient will improve  right forearm and wrist strength to 4+/5 for increased ability to pick up her trays at work and pass out plates of food. Long Term Goal 5 Progress: Progressing toward goal Additional Long Term Goals?: Yes Long Term Goal 6: Patient will decrease fascial restrictions to trace in right wrist and forearm.  Long Term Goal 6 Progress: Progressing toward goal  Problem List Patient Active Problem List   Diagnosis Date Noted  . Muscle weakness (generalized) 12/07/2013  . Lack of coordination 12/07/2013  . Wrist fracture, right 11/24/2013  . Right wrist fracture 10/17/2013  . Distal radius fracture 10/10/2013    End of Session Activity Tolerance: Patient tolerated treatment well General Behavior During Therapy: [facility] for tasks assessed/performed   Limmie PatriciaLaura Elara Cocke, OTR/L,CBIS   12/27/2013, 4:14 PM

## 2013-12-29 ENCOUNTER — Ambulatory Visit (HOSPITAL_COMMUNITY)
Admission: RE | Admit: 2013-12-29 | Discharge: 2013-12-29 | Disposition: A | Discharge status: Home / Self Care | Payer: BC Managed Care – PPO | Source: Ambulatory Visit | Attending: Orthopedic Surgery | Admitting: Orthopedic Surgery

## 2013-12-29 NOTE — Progress Notes (Signed)
Occupational Therapy Treatment Patient Details  Name: ROIZA WIEDEL MRN: [id number] Date of Birth: [birth year]  Today's Date: 12/29/2013 Time: 4098-1191 OT Time Calculation (min): 41 min MFR 4782-9562 10' Therex 1308-6578 31'  Visit#: 4 of 16  Re-eval: 01/04/14    Authorization:    Authorization Time Period:    Authorization Visit#:   of    Subjective Symptoms/Limitations Symptoms: S: I feel lke it's really tight in here (ulnar side of wrist). Pain Assessment Currently in Pain?: Yes Pain Score: 3  Pain Location: Wrist Pain Orientation: Right Pain Type: Acute pain  Precautions/Restrictions  Precautions Precautions: Other (comment) (no lliftinf or weightbearing)  Exercise/Treatments Wrist Exercises Forearm Supination: PROM;AROM;10 reps Forearm Pronation: PROM;AROM;10 reps Wrist Flexion: PROM;AROM;10 reps Wrist Extension: PROM;AROM;10 reps Wrist Radial Deviation: PROM;AROM;10 reps Wrist Ulnar Deviation: PROM;AROM;10 reps   Theraputty - Roll: yellow  Theraputty - Grip: yellow - supinated and pronated Theraputty - Pinch: yellow  Wrist Weighted Stretch Supination - Weighted Stretch: 1 pound;60 seconds Pronation - Weighted Stretch: 1 pound;60 seconds Wrist Flexion - Weighted Stretch: 1 pound;60 seconds Wrist Extension - Weighted Stretch: 1 pound;60 seconds Radial Deviation - Weighted Stretch: 1 pound;60 seconds      Manual Therapy Manual Therapy: Myofascial release Myofascial Release: MFR and manual stretching to RUE wrist and forearm region to decrease fascial restrictions and increase joint mobility in a pain free zone.  Occupational Therapy Assessment and Plan OT Assessment and Plan Clinical Impression Statement: A: Pt complained of pain and tightness along ulnar side of wrist. Pain score has decreased since last visit.  OT Plan: PChauncey Reading for MD appt with Dr. Romeo Apple 01/05/14.   Goals Short Term Goals Time to Complete Short Term Goals: 4 weeks Short Term  Goal 1: Patient will be educated on a HEP. Short Term Goal 2: Patient will improve right forearm and wrist PROM to [facility] for increased ability to complete B/IADLs.    Short Term Goal 3: Patient will improve right forearm and wrist strength to 3+/5 for increased ability to pick up her trays at work. Short Term Goal 4: Patient will increase right grip strength to 35# and pinch strength to 9# to increase independence with opening containers.   Short Term Goal 5: Patient will decrease fascial restrictions to minimal in right wrist and forearm.   Additional Short Term Goals?: Yes Short Term Goal 6: Patient will decrease pain to 3/10 in right wrist and forearm during functional tasks. Long Term Goals Time to Complete Long Term Goals: 8 weeks Long Term Goal 1: Patient will return to prior level of independence with all B/IADLs, work, leisure.  Long Term Goal 2: Patient will improve right forearm and wrist AROM to [facility] for increased ability to complete B/IADLs.    Long Term Goal 3: Patient will increase right grip strength to 50# and pinch strength to 15# to increase independence with opening containers. Long Term Goal 4: Patient will decrease pain to 1/10 in right wrist and forearm when completing functional tasks.  Long Term Goal 5: Patient will improve right forearm and wrist strength to 4+/5 for increased ability to pick up her trays at work and pass out plates of food. Additional Long Term Goals?: Yes Long Term Goal 6: Patient will decrease fascial restrictions to trace in right wrist and forearm.   Problem List Patient Active Problem List   Diagnosis Date Noted  . Muscle weakness (generalized) 12/07/2013  . Lack of coordination 12/07/2013  . Wrist fracture, right 11/24/2013  . Right  wrist fracture 10/17/2013  . Distal radius fracture 10/10/2013    End of Session Activity Tolerance: Patient tolerated treatment well General Behavior During Therapy: [facility] for tasks assessed/performed   Limmie PatriciaLaura  Orlie Cundari, OTR/L,CBIS   12/29/2013, 4:02 PM

## 2014-01-03 ENCOUNTER — Inpatient Hospital Stay (HOSPITAL_COMMUNITY): Admission: RE | Admit: 2014-01-03 | Payer: BC Managed Care – PPO | Source: Ambulatory Visit

## 2014-01-05 ENCOUNTER — Ambulatory Visit (INDEPENDENT_AMBULATORY_CARE_PROVIDER_SITE_OTHER): Payer: BC Managed Care – PPO | Admitting: Orthopedic Surgery

## 2014-01-05 VITALS — BP 139/90 | Ht 63.0 in | Wt 170.0 lb

## 2014-01-05 DIAGNOSIS — S62109A Fracture of unspecified carpal bone, unspecified wrist, initial encounter for closed fracture: Secondary | ICD-10-CM

## 2014-01-05 NOTE — Progress Notes (Signed)
Patient ID: Courtney Ponce, female   DOB: 01/14/1976, 38 y.o.   MRN: [id number]  Chief Complaint  Patient presents with  . Follow-up    6 week recheck right wrist s/p therapy DOI 10/06/13    Right wrist fracture followup the patient has gone to 4 of 16 therapy visits she is improving she is now able to make a full fist she still has stiffness in pronation supination extension good flexion  She was treated with a cast for distal radius fracture  Her injury was approximately 3 months ago.  So we will continue therapy and follow up in 6 weeks she can wean herself from the brace

## 2014-01-05 NOTE — Patient Instructions (Signed)
Continue therapy we will fax new order

## 2014-01-06 ENCOUNTER — Ambulatory Visit (HOSPITAL_COMMUNITY): Payer: BC Managed Care – PPO | Admitting: Specialist

## 2014-01-10 ENCOUNTER — Inpatient Hospital Stay (HOSPITAL_COMMUNITY): Admission: RE | Admit: 2014-01-10 | Payer: BC Managed Care – PPO | Source: Ambulatory Visit

## 2014-02-21 ENCOUNTER — Ambulatory Visit: Payer: BC Managed Care – PPO | Admitting: Orthopedic Surgery

## 2014-02-22 ENCOUNTER — Telehealth (HOSPITAL_COMMUNITY): Payer: Self-pay

## 2014-02-22 NOTE — Telephone Encounter (Signed)
Date: 02/22/14  Spoke with patient today on telephone. Patient's last tx session at our clinic was 12/29/13. [place] and is unable to return to therapy due to distance of driving her scooter. Patient did mention that at her last appt with Dr. Romeo AppleHarrison, he had recommended she continue therapy. Patient was feeling good and has since returned to work and is now experiencing increased pain. Recommended patient request a referral [place] where she may continue her therapy.   Limmie PatriciaLaura Luvia Orzechowski, OTR/L,CBIS

## 2014-02-22 NOTE — Addendum Note (Signed)
Encounter addended by: Jeanene ErbLaura D Essenmacher, OT on: 02/22/2014  2:55 PM<BR>     Documentation filed: Letters, Notes Section, Episodes

## 2014-02-22 NOTE — Progress Notes (Signed)
  Patient Details  Name: Courtney Ponce MRN: [id number] Date of Birth: 09/14/1976  Today's Date: 02/22/2014 Spoke with patient today on telephone. Patient's last tx session at our clinic was 12/29/13. Patient [place] and is unable to return to therapy due to distance of driving her scooter. Patient did mention that at her last appt with Dr. Romeo AppleHarrison, he had recommended she continue therapy. Patient was feeling good and has since returned to work and is now experiencing increased pain. Recommended patient request a referral to an [place] where she may continue her therapy.  [facility].   Courtney Ponce, OTR/L,CBIS   02/22/2014, 2:50 PM

## 2014-08-07 ENCOUNTER — Encounter: Payer: Self-pay | Admitting: Orthopedic Surgery

## 2015-02-20 ENCOUNTER — Other Ambulatory Visit (HOSPITAL_COMMUNITY): Payer: Self-pay | Admitting: Nurse Practitioner

## 2015-02-20 DIAGNOSIS — Z1231 Encounter for screening mammogram for malignant neoplasm of breast: Secondary | ICD-10-CM

## 2015-03-04 ENCOUNTER — Encounter (HOSPITAL_COMMUNITY): Payer: Self-pay | Admitting: *Deleted

## 2015-03-04 ENCOUNTER — Emergency Department (HOSPITAL_COMMUNITY): Payer: Self-pay

## 2015-03-04 ENCOUNTER — Emergency Department (HOSPITAL_COMMUNITY)
Admission: EM | Admit: 2015-03-04 | Discharge: 2015-03-04 | Disposition: A | Discharge status: Home / Self Care | Payer: Self-pay | Attending: Emergency Medicine | Admitting: Emergency Medicine

## 2015-03-04 DIAGNOSIS — W1839XA Other fall on same level, initial encounter: Secondary | ICD-10-CM | POA: Insufficient documentation

## 2015-03-04 DIAGNOSIS — S93402A Sprain of unspecified ligament of left ankle, initial encounter: Secondary | ICD-10-CM | POA: Insufficient documentation

## 2015-03-04 DIAGNOSIS — Z8719 Personal history of other diseases of the digestive system: Secondary | ICD-10-CM | POA: Insufficient documentation

## 2015-03-04 DIAGNOSIS — F329 Major depressive disorder, single episode, unspecified: Secondary | ICD-10-CM | POA: Insufficient documentation

## 2015-03-04 DIAGNOSIS — F419 Anxiety disorder, unspecified: Secondary | ICD-10-CM | POA: Insufficient documentation

## 2015-03-04 DIAGNOSIS — G8929 Other chronic pain: Secondary | ICD-10-CM | POA: Insufficient documentation

## 2015-03-04 DIAGNOSIS — Z720 Tobacco use: Secondary | ICD-10-CM | POA: Insufficient documentation

## 2015-03-04 DIAGNOSIS — Z79899 Other long term (current) drug therapy: Secondary | ICD-10-CM | POA: Insufficient documentation

## 2015-03-04 DIAGNOSIS — Y9289 Other specified places as the place of occurrence of the external cause: Secondary | ICD-10-CM | POA: Insufficient documentation

## 2015-03-04 DIAGNOSIS — Y998 Other external cause status: Secondary | ICD-10-CM | POA: Insufficient documentation

## 2015-03-04 DIAGNOSIS — Y9301 Activity, walking, marching and hiking: Secondary | ICD-10-CM | POA: Insufficient documentation

## 2015-03-04 MED ORDER — PROMETHAZINE HCL 12.5 MG PO TABS
12.5000 mg | ORAL_TABLET | Freq: Once | ORAL | Status: AC
  Administered 2015-03-04: 12.5 mg via ORAL
  Filled 2015-03-04: qty 1

## 2015-03-04 MED ORDER — KETOROLAC TROMETHAMINE 60 MG/2ML IM SOLN
60.0000 mg | Freq: Once | INTRAMUSCULAR | Status: AC
  Administered 2015-03-04: 60 mg via INTRAMUSCULAR
  Filled 2015-03-04: qty 2

## 2015-03-04 MED ORDER — MORPHINE SULFATE 4 MG/ML IJ SOLN
8.0000 mg | Freq: Once | INTRAMUSCULAR | Status: AC
  Administered 2015-03-04: 8 mg via INTRAMUSCULAR
  Filled 2015-03-04: qty 2

## 2015-03-04 MED ORDER — HYDROCODONE-ACETAMINOPHEN 5-325 MG PO TABS: 1.0000 | ORAL_TABLET | ORAL | Status: DC | PRN

## 2015-03-04 MED ORDER — IBUPROFEN 800 MG PO TABS: 800.0000 mg | ORAL_TABLET | Freq: Three times a day (TID) | ORAL | Status: DC

## 2015-03-04 NOTE — ED Notes (Signed)
Pt states she turned her ankle today, unable to bear weight, states ankle is swollen and bruised, CMS intact.

## 2015-03-04 NOTE — ED Provider Notes (Signed)
CSN: [id number]     Arrival date & time 03/04/15  1422 History  This chart was scribed for non-physician practitioner Ivery Quale, PA-C working with Raeford Razor, MD by Conchita Paris, ED Scribe. This patient was seen in APFT23/APFT23 and the patient's care was started at 3:28 PM.   Chief Complaint  Patient presents with  . Ankle Pain    left   Patient is a 38 y.o. female presenting with ankle pain. The history is provided by the patient. No language interpreter was used.  Ankle Pain Location:  Ankle Time since incident:  6 hours Injury: yes   Mechanism of injury: fall   Fall:    Fall occurred:  Walking   Impact surface:  Primary school teacher of impact:  Feet Ankle location:  L ankle Pain details:    Quality:  Sharp   Radiates to:  Does not radiate   Severity:  Moderate   Onset quality:  Sudden   Timing:  Constant   HPI Comments: Courtney Ponce is a 39 y.o. female who presents to the Emergency Department complaining of left ankle pain acute onset this morning. She rolled her ankle and fell while walking outside her home. Pt states she is unable to bear weight. She says the are is very sensitive and warm to the touch.  Past Medical History  Diagnosis Date  . Anxiety   . Depression   . Abnormal Pap smear   . GERD (gastroesophageal reflux disease)   . Neuromuscular disorder   . Chronic back pain    Past Surgical History  Procedure Laterality Date  . Wisdom tooth extraction    . Cystoscopy with urethral dilatation      age 89  . Colposcopy  2002   Family History  Problem Relation Age of Onset  . Cancer Mother     breast w/metastasis to liver,lung,bone, brain  . Hypertension Sister   . Thyroid disease Sister   . Cancer Maternal Aunt     ovarian w/metastasis to colon   History  Substance Use Topics  . Smoking status: Current Every Day Smoker -- 1.00 packs/day for 22 years    Types: Cigarettes  . Smokeless tobacco: Not on file  . Alcohol Use: Yes     Comment:  socially   OB History    Gravida Para Term Preterm AB TAB SAB Ectopic Multiple Living   Review of Systems  Musculoskeletal: Positive for joint swelling, arthralgias and gait problem.  All other systems reviewed and are negative.   Allergies  Shellfish allergy and Wellbutrin  Home Medications   Prior to Admission medications   Medication Sig Start Date End Date Taking? Authorizing Provider  acetaminophen-codeine (TYLENOL #3) 300-30 MG per tablet Take 1 tablet by mouth every 4 (four) hours as needed for moderate pain. 12/13/13   Vickki Hearing, MD  diazepam (VALIUM) 10 MG tablet Take 10 mg by mouth daily as needed for sleep.     Historical Provider, MD  naproxen sodium (ALEVE) 220 MG tablet Take 880 mg by mouth 2 (two) times daily as needed (Pain).    Historical Provider, MD  ondansetron (ZOFRAN ODT) 8 MG disintegrating tablet Take 1 tablet (8 mg total) by mouth every 8 (eight) hours as needed for nausea or vomiting. 10/06/13   Margarita Grizzle, MD  oxyCODONE-acetaminophen (PERCOCET) 7.5-325 MG per tablet Take 1 tablet by mouth every 4 (four) hours  as needed for pain. 11/15/13   Vickki HearingStanley E Harrison, MD  oxyCODONE-acetaminophen (ROXICET) 5-325 MG per tablet Take 1 tablet by mouth every 4 (four) hours as needed for severe pain. 11/28/13   Vickki HearingStanley E Harrison, MD   BP 139/100 mmHg  Pulse 98  Temp(Src) 99.2 F (37.3 C) (Oral)  Resp 24  Ht 5\' 3"  (1.6 m)  Wt 185 lb (83.915 kg)  BMI 32.78 kg/m2  SpO2 99% Physical Exam  Constitutional: She is oriented to person, place, and time. She appears well-developed and well-nourished. No distress.  HENT:  Head: Normocephalic and atraumatic.  Eyes: Pupils are equal, round, and reactive to light.  Neck: Normal range of motion.  Cardiovascular: Normal rate and regular rhythm.   Pulmonary/Chest: Effort normal. No respiratory distress.  Musculoskeletal: Normal range of motion.  Good ROM of the left hip and knee, no deformity. Medial and  lateral malleolus tenderness. Achillis tendon in intact.  Neurological: She is alert and oriented to person, place, and time.  Skin: Skin is warm and dry.  Psychiatric: She has a normal mood and affect. Her behavior is normal.  Nursing note and vitals reviewed.   ED Course  Procedures  DIAGNOSTIC STUDIES: Oxygen Saturation is 99% on room air, normal by my interpretation.    COORDINATION OF CARE: 3:38 PM Will prescribe medication for the pain and refer to an orthopedist. Pt agrees to treatment plan.  Labs Review Labs Reviewed - No data to display  Imaging Review Dg Ankle Complete Left  03/04/2015   CLINICAL DATA:  Patient turned her ankle today with left ankle pain and swelling  EXAM: LEFT ANKLE COMPLETE - 3+ VIEW  COMPARISON:  None.  FINDINGS: Moderate diffuse soft tissue swelling. Evidence of a joint effusion. No fracture.  IMPRESSION: Findings consistent with ankle sprain   Electronically Signed   By: Esperanza Heiraymond  Rubner M.D.   On: 03/04/2015 15:16     EKG Interpretation None      MDM  Blood pressure is elevated at 139/100, otherwise the vital signs are within normal limits. Pulse oximetry is 99% on room air. Within normal limits by my interpretation.  X-ray of the left ankle shows moderate diffuse soft tissue swelling, no fracture noted. The findings were consistent with an ankle sprain. The patient is fitted with an ankle stirrup splint and crutches. Ice pack is been provided. Prescription for ibuprofen 800 mg 3 times daily, and Norco every 4 hours if needed for pain was given to the patient. The patient is advised to see the orthopedist on May 31 if not improving. Patient knowledge is understanding of these discharge distractions.    Final diagnoses:  None    **I personally performed the services described in this documentation, which was scribed in my presence. The recorded information has been reviewed and is accurate.Ivery Quale*     Cambry Spampinato, PA-C 03/06/15 2324  Raeford RazorStephen  Kohut, MD 03/08/15 [phone]

## 2015-03-04 NOTE — Discharge Instructions (Signed)
Your x-ray is negative for fracture or dislocation. Your examination suggest ankle sprain, and there is question of possible ligament injury. It is important that you see Dr. Hilda LiasKeeling, or the orthopedic specialist of your choice concerning this orthopedic injury. Please keep your ankle elevated above your waist. Please apply ice. Please use your crutches until you can safely apply weight to the area. Ankle Sprain An ankle sprain is an injury to the strong, fibrous tissues (ligaments) that hold the bones of your ankle joint together.  CAUSES An ankle sprain is usually caused by a fall or by twisting your ankle. Ankle sprains most commonly occur when you step on the outer edge of your foot, and your ankle turns inward. People who participate in sports are more prone to these types of injuries.  SYMPTOMS   Pain in your ankle. The pain may be present at rest or only when you are trying to stand or walk.  Swelling.  Bruising. Bruising may develop immediately or within 1 to 2 days after your injury.  Difficulty standing or walking, particularly when turning corners or changing directions. DIAGNOSIS  Your caregiver will ask you details about your injury and perform a physical exam of your ankle to determine if you have an ankle sprain. During the physical exam, your caregiver will press on and apply pressure to specific areas of your foot and ankle. Your caregiver will try to move your ankle in certain ways. An X-ray exam may be done to be sure a bone was not broken or a ligament did not separate from one of the bones in your ankle (avulsion fracture).  TREATMENT  Certain types of braces can help stabilize your ankle. Your caregiver can make a recommendation for this. Your caregiver may recommend the use of medicine for pain. If your sprain is severe, your caregiver may refer you to a surgeon who helps to restore function to parts of your skeletal system (orthopedist) or a physical therapist. HOME CARE  INSTRUCTIONS   Apply ice to your injury for 1-2 days or as directed by your caregiver. Applying ice helps to reduce inflammation and pain.  Put ice in a plastic bag.  Place a towel between your skin and the bag.  Leave the ice on for 15-20 minutes at a time, every 2 hours while you are awake.  Only take over-the-counter or prescription medicines for pain, discomfort, or fever as directed by your caregiver.  Elevate your injured ankle above the level of your heart as much as possible for 2-3 days.  If your caregiver recommends crutches, use them as instructed. Gradually put weight on the affected ankle. Continue to use crutches or a cane until you can walk without feeling pain in your ankle.  If you have a plaster splint, wear the splint as directed by your caregiver. Do not rest it on anything harder than a pillow for the first 24 hours. Do not put weight on it. Do not get it wet. You may take it off to take a shower or bath.  You may have been given an elastic bandage to wear around your ankle to provide support. If the elastic bandage is too tight (you have numbness or tingling in your foot or your foot becomes cold and blue), adjust the bandage to make it comfortable.  If you have an air splint, you may blow more air into it or let air out to make it more comfortable. You may take your splint off at night and before  taking a shower or bath. Wiggle your toes in the splint several times per day to decrease swelling. SEEK MEDICAL CARE IF:   You have rapidly increasing bruising or swelling.  Your toes feel extremely cold or you lose feeling in your foot.  Your pain is not relieved with medicine. SEEK IMMEDIATE MEDICAL CARE IF:  Your toes are numb or blue.  You have severe pain that is increasing. MAKE SURE YOU:   Understand these instructions.  Will watch your condition.  Will get help right away if you are not doing well or get worse. Document Released: 09/22/2005 Document  Revised: 06/16/2012 Document Reviewed: 10/04/2011 [facility] Patient Information 2015 [place], [place]. This information is not intended to replace advice given to you by your health care provider. Make sure you discuss any questions you have with your health care provider.

## 2015-03-08 ENCOUNTER — Telehealth: Payer: Self-pay | Admitting: Orthopedic Surgery

## 2015-03-08 NOTE — Telephone Encounter (Signed)
Patient called following [facility] for problem of ankle sprain, date of injury 03/04/15; states has been trying to "hold off" calling, as she has no insurance.  Offered appointment; reviewed self-pay amounts; patient elects to defer at this time and see how she feels with continuing E R physician instructions.  She will also contact [facility] contact person for information on self-pay patients

## 2015-03-14 ENCOUNTER — Ambulatory Visit (HOSPITAL_COMMUNITY): Payer: Self-pay

## 2015-03-25 ENCOUNTER — Emergency Department (HOSPITAL_COMMUNITY)
Admission: EM | Admit: 2015-03-25 | Discharge: 2015-03-25 | Disposition: A | Discharge status: Home / Self Care | Payer: Self-pay | Attending: Emergency Medicine | Admitting: Emergency Medicine

## 2015-03-25 ENCOUNTER — Encounter (HOSPITAL_COMMUNITY): Payer: Self-pay | Admitting: *Deleted

## 2015-03-25 DIAGNOSIS — Z8659 Personal history of other mental and behavioral disorders: Secondary | ICD-10-CM | POA: Insufficient documentation

## 2015-03-25 DIAGNOSIS — H578 Other specified disorders of eye and adnexa: Secondary | ICD-10-CM | POA: Insufficient documentation

## 2015-03-25 DIAGNOSIS — Z8719 Personal history of other diseases of the digestive system: Secondary | ICD-10-CM | POA: Insufficient documentation

## 2015-03-25 DIAGNOSIS — Z79899 Other long term (current) drug therapy: Secondary | ICD-10-CM | POA: Insufficient documentation

## 2015-03-25 DIAGNOSIS — G8929 Other chronic pain: Secondary | ICD-10-CM | POA: Insufficient documentation

## 2015-03-25 DIAGNOSIS — M25572 Pain in left ankle and joints of left foot: Secondary | ICD-10-CM | POA: Insufficient documentation

## 2015-03-25 DIAGNOSIS — Z720 Tobacco use: Secondary | ICD-10-CM | POA: Insufficient documentation

## 2015-03-25 DIAGNOSIS — Z791 Long term (current) use of non-steroidal anti-inflammatories (NSAID): Secondary | ICD-10-CM | POA: Insufficient documentation

## 2015-03-25 DIAGNOSIS — H02843 Edema of right eye, unspecified eyelid: Secondary | ICD-10-CM | POA: Insufficient documentation

## 2015-03-25 MED ORDER — KETOROLAC TROMETHAMINE 60 MG/2ML IM SOLN
60.0000 mg | Freq: Once | INTRAMUSCULAR | Status: AC
  Administered 2015-03-25: 60 mg via INTRAMUSCULAR
  Filled 2015-03-25: qty 2

## 2015-03-25 MED ORDER — DIPHENHYDRAMINE HCL 25 MG PO CAPS
50.0000 mg | ORAL_CAPSULE | Freq: Once | ORAL | Status: AC
  Administered 2015-03-25: 50 mg via ORAL
  Filled 2015-03-25: qty 2

## 2015-03-25 MED ORDER — TETRACAINE HCL 0.5 % OP SOLN
1.0000 [drp] | Freq: Once | OPHTHALMIC | Status: AC
  Administered 2015-03-25: 1 [drp] via OPHTHALMIC
  Filled 2015-03-25: qty 2

## 2015-03-25 MED ORDER — FLUORESCEIN SODIUM 1 MG OP STRP
1.0000 | ORAL_STRIP | Freq: Once | OPHTHALMIC | Status: AC
  Administered 2015-03-25: 1 via OPHTHALMIC
  Filled 2015-03-25: qty 1

## 2015-03-25 MED ORDER — PREDNISONE 20 MG PO TABS
40.0000 mg | ORAL_TABLET | Freq: Once | ORAL | Status: AC
  Administered 2015-03-25: 40 mg via ORAL
  Filled 2015-03-25: qty 2

## 2015-03-25 MED ORDER — PREDNISONE 20 MG PO TABS: ORAL_TABLET | ORAL | Status: DC

## 2015-03-25 NOTE — ED Notes (Signed)
The pt is c/o lt ankle pain for 3 weeks and she was on the way here when her rt eye started itching and burning.  She does not know what caused it  lmp last week

## 2015-03-25 NOTE — Discharge Instructions (Signed)
Return to the emergency room with worsening of symptoms, new symptoms or with symptoms that are concerning, especially fevers, shortness of breath, difficulty breathing, nausea, vomiting, abdominal pain, worsening rash. Call to make follow-up appointment with your primary doctor in 1 week. Take prednisone daily. Benadryl every 6 hours. Read below information and follow recommendations.  Anaphylactic Reaction An anaphylactic reaction is a sudden, severe allergic reaction that involves the whole body. It can be life threatening. A hospital stay is often required. People with asthma, eczema, or hay fever are slightly more likely to have an anaphylactic reaction. CAUSES  An anaphylactic reaction may be caused by anything to which you are allergic. After being exposed to the allergic substance, your immune system becomes sensitized to it. When you are exposed to that allergic substance again, an allergic reaction can occur. Common causes of an anaphylactic reaction include:  Medicines.  Foods, especially peanuts, wheat, shellfish, milk, and eggs.  Insect bites or stings.  Blood products.  Chemicals, such as dyes, latex, and contrast material used for imaging tests. SYMPTOMS  When an allergic reaction occurs, the body releases histamine and other substances. These substances cause symptoms such as tightening of the airway. Symptoms often develop within seconds or minutes of exposure. Symptoms may include:  Skin rash or hives.  Itching.  Chest tightness.  Swelling of the eyes, tongue, or lips.  Trouble breathing or swallowing.  Lightheadedness or fainting.  Anxiety or confusion.  Stomach pains, vomiting, or diarrhea.  Nasal congestion.  A fast or irregular heartbeat (palpitations). DIAGNOSIS  Diagnosis is based on your history of recent exposure to allergic substances, your symptoms, and a physical exam. Your caregiver may also perform blood or urine tests to confirm the  diagnosis. TREATMENT  Epinephrine medicine is the main treatment for an anaphylactic reaction. Other medicines that may be used for treatment include antihistamines, steroids, and albuterol. In severe cases, fluids and medicine to support blood pressure may be given through an intravenous line (IV). Even if you improve after treatment, you need to be observed to make sure your condition does not get worse. This may require a stay in the hospital. [place] a medical alert bracelet or necklace stating your allergy.  You and your family must learn how to use an anaphylaxis kit or give an epinephrine injection to temporarily treat an emergency allergic reaction. Always carry your epinephrine injection or anaphylaxis kit with you. This can be lifesaving if you have a severe reaction.  Do not drive or perform tasks after treatment until the medicines used to treat your reaction have worn off, or until your caregiver says it is okay.  If you have hives or a rash:  Take medicines as directed by your caregiver.  You may use an over-the-counter antihistamine (diphenhydramine) as needed.  Apply cold compresses to the skin or take baths in cool water. Avoid hot baths or showers. SEEK MEDICAL CARE IF:   You develop symptoms of an allergic reaction to a new substance. Symptoms may start right away or minutes later.  You develop a rash, hives, or itching.  You develop new symptoms. SEEK IMMEDIATE MEDICAL CARE IF:   You have swelling of the mouth, difficulty breathing, or wheezing.  You have a tight feeling in your chest or throat.  You develop hives, swelling, or itching all over your body.  You develop severe vomiting or diarrhea.  You feel faint or pass out. This is an emergency. Use your epinephrine injection  or anaphylaxis kit as you have been instructed. Call your local emergency services (911 in [place] Even if you improve after the injection, you need to be examined at  a hospital emergency department. MAKE SURE YOU:   Understand these instructions.  Will watch your condition.  Will get help right away if you are not doing well or get worse. Document Released: 09/22/2005 Document Revised: 09/27/2013 Document Reviewed: 12/24/2011 [facility] Patient Information 2015 [place], [place]. This information is not intended to replace advice given to you by your health care provider. Make sure you discuss any questions you have with your health care provider.  Acute Ankle Sprain with Phase I Rehab An acute ankle sprain is a partial or complete tear in one or more of the ligaments of the ankle due to traumatic injury. The severity of the injury depends on both the number of ligaments sprained and the grade of sprain. There are 3 grades of sprains.   A grade 1 sprain is a mild sprain. There is a slight pull without obvious tearing. There is no loss of strength, and the muscle and ligament are the correct length.  A grade 2 sprain is a moderate sprain. There is tearing of fibers within the substance of the ligament where it connects two bones or two cartilages. The length of the ligament is increased, and there is usually decreased strength.  A grade 3 sprain is a complete rupture of the ligament and is uncommon. In addition to the grade of sprain, there are three types of ankle sprains.  Lateral ankle sprains: This is a sprain of one or more of the three ligaments on the outer side (lateral) of the ankle. These are the most common sprains. Medial ankle sprains: There is one large triangular ligament of the inner side (medial) of the ankle that is susceptible to injury. Medial ankle sprains are less common. Syndesmosis, "high ankle," sprains: The syndesmosis is the ligament that connects the two bones of the lower leg. Syndesmosis sprains usually only occur with very severe ankle sprains. SYMPTOMS  Pain, tenderness, and swelling in the ankle, starting at the side of  injury that may progress to the whole ankle and foot with time.  "Pop" or tearing sensation at the time of injury.  Bruising that may spread to the heel.  Impaired ability to walk soon after injury. CAUSES   Acute ankle sprains are caused by trauma placed on the ankle that temporarily forces or pries the anklebone (talus) out of its normal socket.  Stretching or tearing of the ligaments that normally hold the joint in place (usually due to a twisting injury). RISK INCREASES WITH:  Previous ankle sprain.  Sports in which the foot may land awkwardly (i.e., basketball, volleyball, or soccer) or walking or running on uneven or rough surfaces.  Shoes with inadequate support to prevent sideways motion when stress occurs.  Poor strength and flexibility.  Poor balance skills.  Contact sports. PREVENTION   Warm up and stretch properly before activity.  Maintain physical fitness:  Ankle and leg flexibility, muscle strength, and endurance.  Cardiovascular fitness.  Balance training activities.  Use proper technique and have a coach correct improper technique.  Taping, protective strapping, bracing, or high-top tennis shoes may help prevent injury. Initially, tape is best; however, it loses most of its support function within 10 to 15 minutes.  Wear proper-fitted protective shoes (High-top shoes with taping or bracing is more effective than either alone).  Provide the ankle with support during  sports and practice activities for 12 months following injury. PROGNOSIS   If treated properly, ankle sprains can be expected to recover completely; however, the length of recovery depends on the degree of injury.  A grade 1 sprain usually heals enough in 5 to 7 days to allow modified activity and requires an average of 6 weeks to heal completely.  A grade 2 sprain requires 6 to 10 weeks to heal completely.  A grade 3 sprain requires 12 to 16 weeks to heal.  A syndesmosis sprain often  takes more than 3 months to heal. RELATED COMPLICATIONS   Frequent recurrence of symptoms may result in a chronic problem. Appropriately addressing the problem the first time decreases the frequency of recurrence and optimizes healing time. Severity of the initial sprain does not predict the likelihood of later instability.  Injury to other structures (bone, cartilage, or tendon).  A chronically unstable or arthritic ankle joint is a possibility with repeated sprains. TREATMENT Treatment initially involves the use of ice, medication, and compression bandages to help reduce pain and inflammation. Ankle sprains are usually immobilized in a walking cast or boot to allow for healing. Crutches may be recommended to reduce pressure on the injury. After immobilization, strengthening and stretching exercises may be necessary to regain strength and a full range of motion. Surgery is rarely needed to treat ankle sprains. MEDICATION   Nonsteroidal anti-inflammatory medications, such as aspirin and ibuprofen (do not take for the first 3 days after injury or within 7 days before surgery), or other minor pain relievers, such as acetaminophen, are often recommended. Take these as directed by your caregiver. Contact your caregiver immediately if any bleeding, stomach upset, or signs of an allergic reaction occur from these medications.  Ointments applied to the skin may be helpful.  Pain relievers may be prescribed as necessary by your caregiver. Do not take prescription pain medication for longer than 4 to 7 days. Use only as directed and only as much as you need. HEAT AND COLD  Cold treatment (icing) is used to relieve pain and reduce inflammation for acute and chronic cases. Cold should be applied for 10 to 15 minutes every 2 to 3 hours for inflammation and pain and immediately after any activity that aggravates your symptoms. Use ice packs or an ice massage.  Heat treatment may be used before performing  stretching and strengthening activities prescribed by your caregiver. Use a heat pack or a warm soak. SEEK IMMEDIATE MEDICAL CARE IF:   Pain, swelling, or bruising worsens despite treatment.  You experience pain, numbness, discoloration, or coldness in the foot or toes.  New, unexplained symptoms develop (drugs used in treatment may produce side effects.) EXERCISES  PHASE I EXERCISES RANGE OF MOTION (ROM) AND STRETCHING EXERCISES - Ankle Sprain, Acute Phase I, Weeks 1 to 2 These exercises may help you when beginning to restore flexibility in your ankle. You will likely work on these exercises for the 1 to 2 weeks after your injury. Once your physician, physical therapist, or athletic trainer sees adequate progress, he or she will advance your exercises. While completing these exercises, remember:   Restoring tissue flexibility helps normal motion to return to the joints. This allows healthier, less painful movement and activity.  An effective stretch should be held for at least 30 seconds.  A stretch should never be painful. You should only feel a gentle lengthening or release in the stretched tissue. RANGE OF MOTION - Dorsi/Plantar Flexion  While sitting with your right /  left knee straight, draw the top of your foot upwards by flexing your ankle. Then reverse the motion, pointing your toes downward.  Hold each position for __________ seconds.  After completing your first set of exercises, repeat this exercise with your knee bent. Repeat __________ times. Complete this exercise __________ times per day.  RANGE OF MOTION - Ankle Alphabet  Imagine your right / left big toe is a pen.  Keeping your hip and knee still, write out the entire alphabet with your "pen." Make the letters as large as you can without increasing any discomfort. Repeat __________ times. Complete this exercise __________ times per day.  STRENGTHENING EXERCISES - Ankle Sprain, Acute -Phase I, Weeks 1 to 2 These  exercises may help you when beginning to restore strength in your ankle. You will likely work on these exercises for 1 to 2 weeks after your injury. Once your physician, physical therapist, or athletic trainer sees adequate progress, he or she will advance your exercises. While completing these exercises, remember:   Muscles can gain both the endurance and the strength needed for everyday activities through controlled exercises.  Complete these exercises as instructed by your physician, physical therapist, or athletic trainer. Progress the resistance and repetitions only as guided.  You may experience muscle soreness or fatigue, but the pain or discomfort you are trying to eliminate should never worsen during these exercises. If this pain does worsen, stop and make certain you are following the directions exactly. If the pain is still present after adjustments, discontinue the exercise until you can discuss the trouble with your clinician. STRENGTH - Dorsiflexors  Secure a rubber exercise band/tubing to a fixed object (i.e., table, pole) and loop the other end around your right / left foot.  Sit on the floor facing the fixed object. The band/tubing should be slightly tense when your foot is relaxed.  Slowly draw your foot back toward you using your ankle and toes.  Hold this position for __________ seconds. Slowly release the tension in the band and return your foot to the starting position. Repeat __________ times. Complete this exercise __________ times per day.  STRENGTH - Plantar-flexors   Sit with your right / left leg extended. Holding onto both ends of a rubber exercise band/tubing, loop it around the ball of your foot. Keep a slight tension in the band.  Slowly push your toes away from you, pointing them downward.  Hold this position for __________ seconds. Return slowly, controlling the tension in the band/tubing. Repeat __________ times. Complete this exercise __________ times per  day.  STRENGTH - Ankle Eversion  Secure one end of a rubber exercise band/tubing to a fixed object (table, pole). Loop the other end around your foot just before your toes.  Place your fists between your knees. This will focus your strengthening at your ankle.  Drawing the band/tubing across your opposite foot, slowly, pull your little toe out and up. Make sure the band/tubing is positioned to resist the entire motion.  Hold this position for __________ seconds. Have your muscles resist the band/tubing as it slowly pulls your foot back to the starting position.  Repeat __________ times. Complete this exercise __________ times per day.  STRENGTH - Ankle Inversion  Secure one end of a rubber exercise band/tubing to a fixed object (table, pole). Loop the other end around your foot just before your toes.  Place your fists between your knees. This will focus your strengthening at your ankle.  Slowly, pull your big toe  up and in, making sure the band/tubing is positioned to resist the entire motion.  Hold this position for __________ seconds.  Have your muscles resist the band/tubing as it slowly pulls your foot back to the starting position. Repeat __________ times. Complete this exercises __________ times per day.  STRENGTH - Towel Curls  Sit in a chair positioned on a non-carpeted surface.  Place your right / left foot on a towel, keeping your heel on the floor.  Pull the towel toward your heel by only curling your toes. Keep your heel on the floor.  If instructed by your physician, physical therapist, or athletic trainer, add weight to the end of the towel. Repeat __________ times. Complete this exercise __________ times per day. Document Released: 04/23/2005 Document Revised: 02/06/2014 Document Reviewed: 01/04/2009 [facility] Patient Information 2015 [place], [place]. This information is not intended to replace advice given to you by your health care provider. Make sure you discuss  any questions you have with your health care provider.

## 2015-03-25 NOTE — ED Provider Notes (Signed)
CSN: [id number]     Arrival date & time 03/25/15  2047 History  This chart was scribed for Karmen Stabs, PA-C, working with Linwood Dibbles, MD by Elon Spanner, ED Scribe. This patient was seen in room TR06C/TR06C and the patient's care was started at 10:45 PM.   Chief Complaint  Patient presents with  . Eye Problem  . Ankle Pain   The history is provided by the patient. No language interpreter was used.   HPI Comments: Courtney Ponce is a 39 y.o. female who presents to the Emergency Department complaining of ankle pain and swelling onset 3 weeks ago after an ankle sprain.  She was seen at the [facility] ED at that time and told to RICE the complaint and was referred to an orthopaedist. Negative x-ray. However, she was unable to f/u because she is uninsured.  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  She also complains of right eyelid swelling with associated mild pain and clear drainage onset while the patient was driving to the ED tonight to be seen for her other complaint. Patient states the swelling has improved. 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  She has used OTC eye drops without relief.  She does not wear contacts.  She denies eye injury or pain, fever, chills, n/v, abdominal pain, SOB, rash  Past Medical History  Diagnosis Date  . Anxiety   . Depression   . Abnormal Pap smear   . GERD (gastroesophageal reflux disease)   . Neuromuscular disorder   . Chronic back pain    Past Surgical History  Procedure Laterality Date  . Wisdom tooth extraction    . Cystoscopy with urethral dilatation      age 30  . Colposcopy  2002   Family History  Problem Relation Age of Onset  . Cancer Mother     breast w/metastasis to liver,lung,bone, brain  . Hypertension Sister   . Thyroid disease Sister   . Cancer Maternal Aunt    ovarian w/metastasis to colon   History  Substance Use Topics  . Smoking status: Current Every Day Smoker -- 1.00 packs/day for 22 years    Types: Cigarettes  . Smokeless tobacco: Not on file  . Alcohol Use: Yes     Comment: socially   OB History    Gravida Para Term Preterm AB TAB SAB Ectopic Multiple Living   1 1 1       1      Review of Systems  Constitutional: Negative for fever and chills.  Eyes: Positive for pain and discharge. Negative for visual disturbance.  Respiratory: Negative for shortness of breath.   Gastrointestinal: Negative for nausea, vomiting and abdominal pain.  Musculoskeletal: Positive for joint swelling and arthralgias.  Skin: Negative for rash.      Allergies  Shellfish allergy and Wellbutrin  Home Medications   Prior to Admission medications   Medication Sig Start Date End Date Taking? Authorizing Provider  acetaminophen (TYLENOL) 500 MG tablet Take 1,000 mg by mouth every 6 (six) hours as needed.    Historical Provider, MD  acetaminophen-codeine (TYLENOL #3) 300-30 MG per tablet Take 1 tablet by mouth every 4 (four) hours as needed for moderate pain. 12/13/13   Vickki Hearing, MD  b complex vitamins tablet Take 1 tablet  by mouth daily.    Historical Provider, MD  diazepam (VALIUM) 10 MG tablet Take 10 mg by mouth daily as needed for sleep.     Historical Provider, MD  doxylamine, Sleep, (UNISOM) 25 MG tablet Take 25 mg by mouth at bedtime.    Historical Provider, MD  HYDROcodone-acetaminophen (NORCO/VICODIN) 5-325 MG per tablet Take 1 tablet by mouth every 4 (four) hours as needed. 03/04/15   Ivery Quale, PA-C  ibuprofen (ADVIL,MOTRIN) 800 MG tablet Take 1 tablet (800 mg total) by mouth 3 (three) times daily. 03/04/15   Ivery Quale, PA-C  medroxyPROGESTERone (DEPO-PROVERA) 150 MG/ML injection Inject 150 mg into the muscle every 3 (three) months.    Historical Provider, MD  Multiple Vitamin (MULTIVITAMIN WITH MINERALS) TABS tablet Take 1 tablet  by mouth daily.    Historical Provider, MD  naproxen sodium (ALEVE) 220 MG tablet Take 880 mg by mouth 2 (two) times daily as needed (Pain).    Historical Provider, MD  ondansetron (ZOFRAN ODT) 8 MG disintegrating tablet Take 1 tablet (8 mg total) by mouth every 8 (eight) hours as needed for nausea or vomiting. 10/06/13   Margarita Grizzle, MD  oxyCODONE-acetaminophen (PERCOCET) 7.5-325 MG per tablet Take 1 tablet by mouth every 4 (four) hours as needed for pain. 11/15/13   Vickki Hearing, MD  oxyCODONE-acetaminophen (ROXICET) 5-325 MG per tablet Take 1 tablet by mouth every 4 (four) hours as needed for severe pain. 11/28/13   Vickki Hearing, MD  predniSONE (DELTASONE) 20 MG tablet 2 tabs po daily x 5 days 03/25/15   Oswaldo Conroy, PA-C   BP 122/79 mmHg  Pulse 62  Temp(Src) 98.3 F (36.8 C) (Oral)  Resp 20  Ht  (1.6 m)  Wt 187 lb 8 oz (85.049 kg)  BMI 33.22 kg/m2  SpO2 100% Physical Exam  Constitutional: She appears well-developed and well-nourished. No distress.  HENT:  Head: Normocephalic and atraumatic.  Eyes: Conjunctivae and EOM are normal. Pupils are equal, round, and reactive to light. Lids are everted and swept, no foreign bodies found. Right eye exhibits discharge. No foreign body present in the right eye. Left eye exhibits no discharge. No foreign body present in the left eye. Right conjunctiva is not injected. Right conjunctiva has no hemorrhage. Left conjunctiva is not injected. Left conjunctiva has no hemorrhage.  Slit lamp exam:      The right eye shows no corneal abrasion and no fluorescein uptake.       The left eye shows no corneal abrasion and no fluorescein uptake.  EOM intact without significant pain.  No paralysis.  Minimal swelling to right upper eye lid.  Clear drainage.  No fluoroscein uptake.  Left intraocular pressure: 15; Right intraocular pressure: 12.   Cardiovascular: Normal rate and regular rhythm.   Pulmonary/Chest: Effort normal and breath sounds  normal. No respiratory distress. She has no wheezes.  Abdominal: Soft. Bowel sounds are normal. She exhibits no distension. There is no tenderness.  Musculoskeletal:  Pain around left lateral malleolus.  No significant swelling or erythema. No wound or ecchymoses. No other ankle tenderness. Full range of motion. Neurovascularly intact.  Neurological: She is alert. She exhibits normal muscle tone. Coordination normal.  Skin: Skin is warm and dry. She is not diaphoretic.  Nursing note and vitals reviewed.     Visual Acuity  Right Eye Distance: 20/30 Left Eye Distance: 20/20 Bilateral Distance: 20/20  Right Eye Near:   Left Eye Near:    Bilateral Near:  ED Course  Procedures (including critical care time)  DIAGNOSTIC STUDIES: Oxygen Saturation is 97% on RA, normal by my interpretation.    COORDINATION OF CARE:  10:46 PM Advised patient to RICE ankle and f/u with orthopaedist.  Will prescribe and order benadryl and prednisone.  Patient advised of return precautions.  Patient acknowledges and agrees with plan.    Labs Review Labs Reviewed - No data to display  Imaging Review No results found.   EKG Interpretation None      Meds given in ED:  Medications  fluorescein ophthalmic strip 1 strip (1 strip Both Eyes Given 03/25/15 2213)  tetracaine (PONTOCAINE) 0.5 % ophthalmic solution 1 drop (1 drop Both Eyes Given 03/25/15 2213)  ketorolac (TORADOL) injection 60 mg (60 mg Intramuscular Given 03/25/15 2303)  diphenhydrAMINE (BENADRYL) capsule 50 mg (50 mg Oral Given 03/25/15 2303)  predniSONE (DELTASONE) tablet 40 mg (40 mg Oral Given 03/25/15 2303)    Discharge Medication List as of 03/25/2015 11:37 PM    START taking these medications   Details  predniSONE (DELTASONE) 20 MG tablet 2 tabs po daily x 5 days, Print          MDM   Final diagnoses:  Swelling of eyelid, right  Left ankle pain   Patient monitored in ED for 3 hours with significant improvement of her eye  lid swelling. No systemic symptoms. No shortness of breath, difficulty breathing or other rash. Patient to follow-up with podiatry or orthopedics as previously referred for persistent ankle pain. Discussed RICE and ibuprofen use.  Discussed return precautions with patient. Discussed all results and patient verbalizes understanding and agrees with plan.  I personally performed the services described in this documentation, which was scribed in my presence. The recorded information has been reviewed and is accurate.   Oswaldo Conroy, PA-C 03/26/15 0023  Linwood Dibbles, MD 03/26/15 [phone]

## 2015-12-13 ENCOUNTER — Emergency Department (HOSPITAL_COMMUNITY): Payer: Self-pay

## 2015-12-13 ENCOUNTER — Encounter (HOSPITAL_COMMUNITY): Payer: Self-pay | Admitting: Emergency Medicine

## 2015-12-13 ENCOUNTER — Observation Stay (HOSPITAL_COMMUNITY)
Admission: EM | Admit: 2015-12-13 | Discharge: 2015-12-14 | Disposition: A | Discharge status: Home / Self Care | Payer: Self-pay | Attending: Internal Medicine | Admitting: Internal Medicine

## 2015-12-13 DIAGNOSIS — Z888 Allergy status to other drugs, medicaments and biological substances status: Secondary | ICD-10-CM | POA: Insufficient documentation

## 2015-12-13 DIAGNOSIS — E876 Hypokalemia: Secondary | ICD-10-CM | POA: Insufficient documentation

## 2015-12-13 DIAGNOSIS — M549 Dorsalgia, unspecified: Secondary | ICD-10-CM | POA: Insufficient documentation

## 2015-12-13 DIAGNOSIS — F17210 Nicotine dependence, cigarettes, uncomplicated: Secondary | ICD-10-CM | POA: Insufficient documentation

## 2015-12-13 DIAGNOSIS — Z23 Encounter for immunization: Secondary | ICD-10-CM | POA: Insufficient documentation

## 2015-12-13 DIAGNOSIS — E871 Hypo-osmolality and hyponatremia: Secondary | ICD-10-CM | POA: Insufficient documentation

## 2015-12-13 DIAGNOSIS — Z91013 Allergy to seafood: Secondary | ICD-10-CM | POA: Insufficient documentation

## 2015-12-13 DIAGNOSIS — N390 Urinary tract infection, site not specified: Principal | ICD-10-CM | POA: Diagnosis present

## 2015-12-13 DIAGNOSIS — F329 Major depressive disorder, single episode, unspecified: Secondary | ICD-10-CM | POA: Insufficient documentation

## 2015-12-13 DIAGNOSIS — G8929 Other chronic pain: Secondary | ICD-10-CM | POA: Insufficient documentation

## 2015-12-13 DIAGNOSIS — F1410 Cocaine abuse, uncomplicated: Secondary | ICD-10-CM | POA: Insufficient documentation

## 2015-12-13 DIAGNOSIS — F419 Anxiety disorder, unspecified: Secondary | ICD-10-CM | POA: Insufficient documentation

## 2015-12-13 DIAGNOSIS — J439 Emphysema, unspecified: Secondary | ICD-10-CM | POA: Insufficient documentation

## 2015-12-13 DIAGNOSIS — K219 Gastro-esophageal reflux disease without esophagitis: Secondary | ICD-10-CM | POA: Insufficient documentation

## 2015-12-13 LAB — COMPREHENSIVE METABOLIC PANEL
ALBUMIN: 2.3 g/dL — AB (ref 3.5–5.0)
ALK PHOS: 73 U/L (ref 38–126)
ALT: 12 U/L — AB (ref 14–54)
ANION GAP: 7 (ref 5–15)
AST: 17 U/L (ref 15–41)
BUN: 24 mg/dL — AB (ref 6–20)
CALCIUM: 7.6 mg/dL — AB (ref 8.9–10.3)
CO2: 24 mmol/L (ref 22–32)
CREATININE: 1.07 mg/dL — AB (ref 0.44–1.00)
Chloride: 98 mmol/L — ABNORMAL LOW (ref 101–111)
GFR calc Af Amer: >60 mL/min (ref >60)
GFR calc non Af Amer: >60 mL/min (ref >60)
GLUCOSE: 150 mg/dL — AB (ref 65–99)
Potassium: 2.8 mmol/L — ABNORMAL LOW (ref 3.5–5.1)
Sodium: 129 mmol/L — ABNORMAL LOW (ref 135–145)
TOTAL PROTEIN: 6.1 g/dL — AB (ref 6.5–8.1)
Total Bilirubin: 0.8 mg/dL (ref 0.3–1.2)

## 2015-12-13 LAB — RAPID URINE DRUG SCREEN, HOSP PERFORMED
AMPHETAMINES: NOT DETECTED
BARBITURATES: NOT DETECTED
BENZODIAZEPINES: NOT DETECTED
Cocaine: POSITIVE — AB
Opiates: POSITIVE — AB
Tetrahydrocannabinol: POSITIVE — AB

## 2015-12-13 LAB — URINE MICROSCOPIC-ADD ON

## 2015-12-13 LAB — CBC WITH DIFFERENTIAL/PLATELET
BASOS PCT: 0 %
Basophils Absolute: 0 10*3/uL (ref 0.0–0.1)
EOS ABS: 0 10*3/uL (ref 0.0–0.7)
EOS PCT: 0 %
HCT: 33.2 % — ABNORMAL LOW (ref 36.0–46.0)
HEMOGLOBIN: 11.3 g/dL — AB (ref 12.0–15.0)
Lymphocytes Relative: 8 %
Lymphs Abs: 1.5 10*3/uL (ref 0.7–4.0)
MCH: 31.8 pg (ref 26.0–34.0)
MCHC: 34 g/dL (ref 30.0–36.0)
MCV: 93.5 fL (ref 78.0–100.0)
MONO ABS: 1.7 10*3/uL — AB (ref 0.1–1.0)
Monocytes Relative: 10 %
Neutro Abs: 14.6 10*3/uL — ABNORMAL HIGH (ref 1.7–7.7)
Neutrophils Relative %: 82 %
Platelets: 153 10*3/uL (ref 150–400)
RBC: 3.55 MIL/uL — ABNORMAL LOW (ref 3.87–5.11)
RDW: 12.7 % (ref 11.5–15.5)
WBC: 17.8 10*3/uL — ABNORMAL HIGH (ref 4.0–10.5)

## 2015-12-13 LAB — URINALYSIS, ROUTINE W REFLEX MICROSCOPIC
BILIRUBIN URINE: NEGATIVE
Glucose, UA: NEGATIVE mg/dL
Ketones, ur: NEGATIVE mg/dL
Nitrite: NEGATIVE
PH: 5.5 (ref 5.0–8.0)
Specific Gravity, Urine: <1.005 — ABNORMAL LOW (ref 1.005–1.030)

## 2015-12-13 LAB — I-STAT CG4 LACTIC ACID, ED
Lactic Acid, Venous: 0.96 mmol/L (ref 0.5–2.0)
Lactic Acid, Venous: 1.03 mmol/L (ref 0.5–2.0)

## 2015-12-13 LAB — LIPASE, BLOOD: Lipase: 23 U/L (ref 11–51)

## 2015-12-13 LAB — PREGNANCY, URINE: PREG TEST UR: NEGATIVE

## 2015-12-13 MED ORDER — NALOXONE HCL 2 MG/2ML IJ SOSY
1.0000 mg | PREFILLED_SYRINGE | Freq: Once | INTRAMUSCULAR | Status: AC
  Administered 2015-12-13: 1 mg via INTRAVENOUS
  Filled 2015-12-13: qty 2

## 2015-12-13 MED ORDER — SODIUM CHLORIDE 0.9 % IV BOLUS (SEPSIS)
1000.0000 mL | Freq: Once | INTRAVENOUS | Status: AC
  Administered 2015-12-13: 1000 mL via INTRAVENOUS

## 2015-12-13 MED ORDER — SODIUM CHLORIDE 0.9 % IV BOLUS (SEPSIS)
1000.0000 mL | Freq: Once | INTRAVENOUS | Status: AC
  Administered 2015-12-13 (×2): 1000 mL via INTRAVENOUS

## 2015-12-13 MED ORDER — POTASSIUM CHLORIDE CRYS ER 20 MEQ PO TBCR
40.0000 meq | EXTENDED_RELEASE_TABLET | Freq: Once | ORAL | Status: AC
  Administered 2015-12-13: 40 meq via ORAL
  Filled 2015-12-13: qty 2

## 2015-12-13 MED ORDER — DEXTROSE 5 % IV SOLN
1.0000 g | Freq: Once | INTRAVENOUS | Status: AC
  Administered 2015-12-13: 1 g via INTRAVENOUS
  Filled 2015-12-13: qty 10

## 2015-12-13 NOTE — ED Notes (Signed)
Pt with small amount of feces

## 2015-12-13 NOTE — ED Provider Notes (Signed)
CSN: [id number]     Arrival date & time 12/13/15  1726 History   First MD Initiated Contact with Patient 12/13/15 1806     Chief Complaint  Patient presents with  . Abdominal Pain     (Consider location/radiation/quality/duration/timing/severity/associated sxs/prior Treatment) Patient is a 40 y.o. female presenting with abdominal pain. The history is provided by the patient (Patient complains of lower abdominal pain.).  Abdominal Pain Pain location:  Suprapubic Pain quality: aching   Pain radiates to:  Does not radiate Pain severity:  Mild Onset quality:  Gradual Timing:  Constant Progression:  Waxing and waning Chronicity:  New Context: alcohol use   Associated symptoms: no chest pain, no cough, no diarrhea, no fatigue and no hematuria     Past Medical History  Diagnosis Date  . Anxiety   . Depression   . Abnormal Pap smear   . GERD (gastroesophageal reflux disease)   . Neuromuscular disorder (HCC)   . Chronic back pain    Past Surgical History  Procedure Laterality Date  . Wisdom tooth extraction    . Cystoscopy with urethral dilatation      age 89  . Colposcopy  2002   Family History  Problem Relation Age of Onset  . Cancer Mother     breast w/metastasis to liver,lung,bone, brain  . Hypertension Sister   . Thyroid disease Sister   . Cancer Maternal Aunt     ovarian w/metastasis to colon   Social History  Substance Use Topics  . Smoking status: Current Every Day Smoker -- 1.00 packs/day for 22 years    Types: Cigarettes  . Smokeless tobacco: None  . Alcohol Use: Yes     Comment: socially   OB History    Gravida Para Term Preterm AB TAB SAB Ectopic Multiple Living   1 1 1       1      Review of Systems  Constitutional: Negative for appetite change and fatigue.  HENT: Negative for congestion, ear discharge and sinus pressure.   Eyes: Negative for discharge.  Respiratory: Negative for cough.   Cardiovascular: Negative for chest pain.  Gastrointestinal:  Positive for abdominal pain. Negative for diarrhea.  Genitourinary: Negative for frequency and hematuria.  Musculoskeletal: Negative for back pain.  Skin: Negative for rash.  Neurological: Negative for seizures and headaches.  Psychiatric/Behavioral: Negative for hallucinations.      Allergies  Shellfish allergy and Wellbutrin  Home Medications   Prior to Admission medications   Medication Sig Start Date End Date Taking? Authorizing Provider  acetaminophen-codeine (TYLENOL #3) 300-30 MG per tablet Take 1 tablet by mouth every 4 (four) hours as needed for moderate pain. 12/13/13  Yes Vickki HearingStanley E Harrison, MD  diazepam (VALIUM) 10 MG tablet Take 10 mg by mouth daily as needed for sleep.    Yes Historical Provider, MD  ibuprofen (ADVIL,MOTRIN) 800 MG tablet Take 1 tablet (800 mg total) by mouth 3 (three) times daily. 03/04/15  Yes Ivery QualeHobson Bryant, PA-C   BP 91/58 mmHg  Pulse 62  Temp(Src) 97.9 F (36.6 C) (Oral)  Resp 14  Ht 5\' 3"  (1.6 m)  Wt 187 lb (84.823 kg)  BMI 33.13 kg/m2  SpO2 100% Physical Exam  Constitutional: She is oriented to person, place, and time. She appears well-developed.  HENT:  Head: Normocephalic.  Eyes: Conjunctivae and EOM are normal. No scleral icterus.  Neck: Neck supple. No thyromegaly present.  Cardiovascular: Normal rate and regular rhythm.  Exam reveals no gallop and no  friction rub.   No murmur heard. Pulmonary/Chest: No stridor. She has no wheezes. She has no rales. She exhibits no tenderness.  Abdominal: She exhibits no distension. There is tenderness. There is no rebound.  Minimal suprapubic tenderness  Musculoskeletal: Normal range of motion. She exhibits no edema.  Lymphadenopathy:    She has no cervical adenopathy.  Neurological: She is oriented to person, place, and time. She exhibits normal muscle tone. Coordination normal.  Patient is mildly lethargic  Skin: No rash noted. No erythema.  Psychiatric: She has a normal mood and affect. Her  behavior is normal.    ED Course  Procedures (including critical care time) Labs Review Labs Reviewed  COMPREHENSIVE METABOLIC PANEL - Abnormal; Notable for the following:    Sodium 129 (*)    Potassium 2.8 (*)    Chloride 98 (*)    Glucose, Bld 150 (*)    BUN 24 (*)    Creatinine, Ser 1.07 (*)    Calcium 7.6 (*)    Total Protein 6.1 (*)    Albumin 2.3 (*)    ALT 12 (*)    All other components within normal limits  URINALYSIS, ROUTINE W REFLEX MICROSCOPIC (NOT AT [facility]) - Abnormal; Notable for the following:    Specific Gravity, Urine <1.005 (*)    Hgb urine dipstick SMALL (*)    Protein, ur TRACE (*)    Leukocytes, UA MODERATE (*)    All other components within normal limits  CBC WITH DIFFERENTIAL/PLATELET - Abnormal; Notable for the following:    WBC 17.8 (*)    RBC 3.55 (*)    Hemoglobin 11.3 (*)    HCT 33.2 (*)    Neutro Abs 14.6 (*)    Monocytes Absolute 1.7 (*)    All other components within normal limits  URINE MICROSCOPIC-ADD ON - Abnormal; Notable for the following:    Squamous Epithelial / LPF 6-30 (*)    Bacteria, UA MANY (*)    All other components within normal limits  URINE CULTURE  LIPASE, BLOOD  I-STAT CG4 LACTIC ACID, ED  I-STAT CG4 LACTIC ACID, ED    Imaging Review Dg Abd Acute W/chest  12/13/2015  CLINICAL DATA:  Patient complains of lower abdominal pain and nausea. States she vomited Tuesday. Pt states she has not had a BM in 2weeks. Current smoker. EXAM: DG ABDOMEN ACUTE W/ 1V CHEST COMPARISON:  08/19/2007 FINDINGS: Emphysema. Borderline enlargement of the cardiopericardial silhouette, without edema. No free intraperitoneal gas beneath the hemidiaphragms. No air-fluid levels. No dilated bowel. Unremarkable bowel gas pattern. IMPRESSION: 1. Emphysema. 2. Borderline enlargement of the cardiopericardial silhouette. Electronically Signed   By: Gaylyn Rong M.D.   On: 12/13/2015 18:41   I have personally reviewed and evaluated these images and lab  results as part of my medical decision-making.   EKG Interpretation None     Patient's lethargy improved with Narcan MDM   Final diagnoses:  None    Patient with urinary tract infection dehydration hypokalemia. Patient will be admitted for antibiotics and IV fluids  `  Bethann Berkshire, MD 12/13/15 2144

## 2015-12-13 NOTE — ED Notes (Addendum)
Patient complains of lower abdominal pain and nausea. States she vomited Tuesday. Admits to crack cocaine use 3 days ago. States she has not had a bowel movement in 2 weeks.

## 2015-12-13 NOTE — ED Notes (Signed)
Pt complains of nausea- had a large BM of firm brown stool- after having narcan

## 2015-12-13 NOTE — ED Notes (Signed)
Pt is lethargic with slurred speech, she is edentulous and has a friend at bedside/ she request pos and a meal but is so lethargic and hypotensive ice chips offered and accepted

## 2015-12-13 NOTE — ED Notes (Signed)
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 She reports her longest tome clean was 6 years but has been using cocaine and pain pills daily since ?. Education AA/NA for both patient and partner as she reports he is an alcoholic, area mental health agency for detox outpt referral, treatment

## 2015-12-13 NOTE — H&P (Signed)
PCP:   No PCP Per Patient   Chief Complaint:  Abdominal pain  HPI: 40 year old female who   has a past medical history of Anxiety; Depression; Abnormal Pap smear; GERD (gastroesophageal reflux disease); Neuromuscular disorder (HCC); and Chronic back pain. Today came to the ED with lower abdominal pain, and difficulty urination. Patient has history of cocaine abuse. Was given Narcan in the ED as she was lethargic. Patient now alert and oriented 3. Able to answer all questions. She denies chest pain or shortness of breath. No nausea or vomiting. Patient had constipation for a few days but started having loose bowel movements in the ED. UA showed numerous WBCs. Patient started on ceftriaxone   Allergies:   Allergies  Allergen Reactions  . Shellfish Allergy Anaphylaxis  . Wellbutrin [Bupropion Hcl] Hives      Past Medical History  Diagnosis Date  . Anxiety   . Depression   . Abnormal Pap smear   . GERD (gastroesophageal reflux disease)   . Neuromuscular disorder (HCC)   . Chronic back pain     Past Surgical History  Procedure Laterality Date  . Wisdom tooth extraction    . Cystoscopy with urethral dilatation      age 68  . Colposcopy  2002    Prior to Admission medications   Medication Sig Start Date End Date Taking? Authorizing Provider  acetaminophen-codeine (TYLENOL #3) 300-30 MG per tablet Take 1 tablet by mouth every 4 (four) hours as needed for moderate pain. 12/13/13  Yes Vickki Hearing, MD  diazepam (VALIUM) 10 MG tablet Take 10 mg by mouth daily as needed for sleep.    Yes Historical Provider, MD  ibuprofen (ADVIL,MOTRIN) 800 MG tablet Take 1 tablet (800 mg total) by mouth 3 (three) times daily. 03/04/15  Yes Ivery Quale, PA-C    Social History:  reports that she has been smoking Cigarettes.  She has a 22 pack-year smoking history. She does not have any smokeless tobacco history on file. She reports that she drinks alcohol. She reports that she uses  illicit drugs (Cocaine).  Family History  Problem Relation Age of Onset  . Cancer Mother     breast w/metastasis to liver,lung,bone, brain  . Hypertension Sister   . Thyroid disease Sister   . Cancer Maternal Aunt     ovarian w/metastasis to colon    Filed Weights   12/13/15 1744  Weight: 84.823 kg (187 lb)    All the positives are listed in BOLD  Review of Systems:  HEENT: Headache, blurred vision, runny nose, sore throat Neck: Hypothyroidism, hyperthyroidism,,lymphadenopathy Chest : Shortness of breath, history of COPD, Asthma Heart : Chest pain, history of coronary arterey disease GI:  Nausea, vomiting, diarrhea, constipation, GERD GU: Dysuria, urgency, frequency of urination, hematuria Neuro: Stroke, seizures, syncope Psych: Depression, anxiety, hallucinations   Physical Exam: Blood pressure 97/60, pulse 59, temperature 97.9 F (36.6 C), temperature source Oral, resp. rate 14, height  (1.6 m), weight 84.823 kg (187 lb), SpO2 100 %. Constitutional:   Patient is a well-developed and well-nourished female in no acute distress and cooperative with exam. Head: Normocephalic and atraumatic Mouth: Mucus membranes moist Eyes: PERRL, EOMI, conjunctivae normal Neck: Supple, No Thyromegaly Cardiovascular: RRR, S1 normal, S2 normal Pulmonary/Chest: CTAB, no wheezes, rales, or rhonchi Abdominal: Soft. Suprapubic tenderness, non-distended, bowel sounds are normal, no masses, organomegaly, or guarding present.  Neurological: A&O x3, Strength is normal and symmetric bilaterally, cranial nerve II-XII are grossly intact, no focal motor  deficit, sensory intact to light touch bilaterally.  Extremities : No Cyanosis, Clubbing or Edema  Labs on Admission:  Basic Metabolic Panel:  Recent Labs Lab 12/13/15 1817  NA 129*  K 2.8*  CL 98*  CO2 24  GLUCOSE 150*  BUN 24*  CREATININE 1.07*  CALCIUM 7.6*   Liver Function Tests:  Recent Labs Lab 12/13/15 1817  AST 17  ALT  12*  ALKPHOS 73  BILITOT 0.8  PROT 6.1*  ALBUMIN 2.3*    Recent Labs Lab 12/13/15 1817  LIPASE 23   No results for input(s): AMMONIA in the last 168 hours. CBC:  Recent Labs Lab 12/13/15 1817  WBC 17.8*  NEUTROABS 14.6*  HGB 11.3*  HCT 33.2*  MCV 93.5  PLT 153    Radiological Exams on Admission: Dg Abd Acute W/chest  12/13/2015  CLINICAL DATA:  Patient complains of lower abdominal pain and nausea. States she vomited Tuesday. Pt states she has not had a BM in 2weeks. Current smoker. EXAM: DG ABDOMEN ACUTE W/ 1V CHEST COMPARISON:  08/19/2007 FINDINGS: Emphysema. Borderline enlargement of the cardiopericardial silhouette, without edema. No free intraperitoneal gas beneath the hemidiaphragms. No air-fluid levels. No dilated bowel. Unremarkable bowel gas pattern. IMPRESSION: 1. Emphysema. 2. Borderline enlargement of the cardiopericardial silhouette. Electronically Signed   By: Gaylyn RongWalter  Liebkemann M.D.   On: 12/13/2015 18:41    EKG: Independently reviewed. Sinus rhythm   Assessment/Plan Active Problems:   UTI (lower urinary tract infection)   Cocaine abuse    Hypokalemia    Hyponatremia  UTI Would admit the patient, start ceftriaxone. Follow urine culture  Hypokalemia Replace potassium and check BMP in a.m.  Hyponatremia Check urine and serum osmolality Follow BMP in a.m.  Substance abuse Urine dressing positive for opiates, cocaine, tetrahydrocannabinol. Consult social work for  rehabilitation  DVT prophylaxis Lovenox  Code status: Full code  Family discussion: Admission, patients condition and plan of care including tests being ordered have been discussed with the patient and family member at bedside who indicate understanding and agree with the plan and Code Status.   Time Spent on Admission: 60 min  LAMA,GAGAN S [facility] Pager: [phone] 12/13/2015, 10:37 PM  If 7PM-7AM, please contact night-coverage  [email or web address]  Password [email or web address]

## 2015-12-14 DIAGNOSIS — N390 Urinary tract infection, site not specified: Principal | ICD-10-CM

## 2015-12-14 DIAGNOSIS — F1410 Cocaine abuse, uncomplicated: Secondary | ICD-10-CM

## 2015-12-14 LAB — COMPREHENSIVE METABOLIC PANEL
ALK PHOS: 75 U/L (ref 38–126)
ALT: 13 U/L — AB (ref 14–54)
AST: 21 U/L (ref 15–41)
Albumin: 2.2 g/dL — ABNORMAL LOW (ref 3.5–5.0)
Anion gap: 6 (ref 5–15)
BUN: 18 mg/dL (ref 6–20)
CALCIUM: 7.5 mg/dL — AB (ref 8.9–10.3)
CHLORIDE: 104 mmol/L (ref 101–111)
CO2: 25 mmol/L (ref 22–32)
CREATININE: 0.94 mg/dL (ref 0.44–1.00)
GFR calc non Af Amer: >60 mL/min (ref >60)
GLUCOSE: 137 mg/dL — AB (ref 65–99)
Potassium: 3.9 mmol/L (ref 3.5–5.1)
SODIUM: 135 mmol/L (ref 135–145)
Total Bilirubin: 0.3 mg/dL (ref 0.3–1.2)
Total Protein: 5.9 g/dL — ABNORMAL LOW (ref 6.5–8.1)

## 2015-12-14 LAB — CBC
HCT: 34.2 % — ABNORMAL LOW (ref 36.0–46.0)
Hemoglobin: 11.5 g/dL — ABNORMAL LOW (ref 12.0–15.0)
MCH: 31.9 pg (ref 26.0–34.0)
MCHC: 33.6 g/dL (ref 30.0–36.0)
MCV: 95 fL (ref 78.0–100.0)
Platelets: 126 10*3/uL — ABNORMAL LOW (ref 150–400)
RBC: 3.6 MIL/uL — ABNORMAL LOW (ref 3.87–5.11)
RDW: 12.9 % (ref 11.5–15.5)
WBC: 9.8 10*3/uL (ref 4.0–10.5)

## 2015-12-14 LAB — MAGNESIUM: MAGNESIUM: 2.2 mg/dL (ref 1.7–2.4)

## 2015-12-14 LAB — OSMOLALITY: Osmolality: 284 mOsm/kg (ref 275–295)

## 2015-12-14 LAB — MRSA PCR SCREENING: MRSA BY PCR: NEGATIVE

## 2015-12-14 MED ORDER — INFLUENZA VAC SPLIT QUAD 0.5 ML IM SUSY
0.5000 mL | PREFILLED_SYRINGE | INTRAMUSCULAR | Status: AC
  Administered 2015-12-14: 0.5 mL via INTRAMUSCULAR

## 2015-12-14 MED ORDER — INFLUENZA VAC SPLIT QUAD 0.5 ML IM SUSY
0.5000 mL | PREFILLED_SYRINGE | INTRAMUSCULAR | Status: DC
  Filled 2015-12-14: qty 0.5

## 2015-12-14 MED ORDER — CIPROFLOXACIN HCL 500 MG PO TABS: 500.0000 mg | ORAL_TABLET | Freq: Two times a day (BID) | ORAL | Status: DC

## 2015-12-14 MED ORDER — ENOXAPARIN SODIUM 40 MG/0.4ML ~~LOC~~ SOLN
40.0000 mg | SUBCUTANEOUS | Status: DC
  Administered 2015-12-14: 40 mg via SUBCUTANEOUS
  Filled 2015-12-14: qty 0.4

## 2015-12-14 MED ORDER — ONDANSETRON HCL 4 MG PO TABS: 4.0000 mg | ORAL_TABLET | Freq: Four times a day (QID) | ORAL | Status: DC | PRN

## 2015-12-14 MED ORDER — PNEUMOCOCCAL VAC POLYVALENT 25 MCG/0.5ML IJ INJ
0.5000 mL | INJECTION | INTRAMUSCULAR | Status: DC
  Filled 2015-12-14: qty 0.5

## 2015-12-14 MED ORDER — ONDANSETRON HCL 4 MG/2ML IJ SOLN
4.0000 mg | Freq: Four times a day (QID) | INTRAMUSCULAR | Status: DC | PRN
Start: 2015-12-14 — End: 2015-12-14

## 2015-12-14 MED ORDER — DEXTROSE 5 % IV SOLN
1.0000 g | INTRAVENOUS | Status: DC
  Filled 2015-12-14 (×3): qty 10

## 2015-12-14 MED ORDER — PNEUMOCOCCAL VAC POLYVALENT 25 MCG/0.5ML IJ INJ
0.5000 mL | INJECTION | Freq: Once | INTRAMUSCULAR | Status: AC
  Administered 2015-12-14: 0.5 mL via INTRAMUSCULAR

## 2015-12-14 MED ORDER — SODIUM CHLORIDE 0.9 % IV SOLN
INTRAVENOUS | Status: DC
  Administered 2015-12-14: 05:00:00 via INTRAVENOUS
  Administered 2015-12-14: 750 mL via INTRAVENOUS

## 2015-12-14 MED ORDER — POTASSIUM CHLORIDE 10 MEQ/100ML IV SOLN
10.0000 meq | INTRAVENOUS | Status: AC
  Administered 2015-12-14 (×3): 10 meq via INTRAVENOUS
  Filled 2015-12-14 (×3): qty 100

## 2015-12-14 NOTE — Progress Notes (Signed)
Pharmacy Antibiotic Note  Courtney Ponce is a 40 y.o. female admitted on 12/13/2015 with UTI.  Pharmacy has been consulted for ceftriaxone dosing.  Plan: Ceftriaxone 1gm IV every 24 hours  Height: 5\' 3"  (160 cm) Weight: 146 lb 6.2 oz (66.4 kg) IBW/kg (Calculated) : 52.4  Temp (24hrs), Avg:99.1 F (37.3 C), Min:97.9 F (36.6 C), Max:101.7 F (38.7 C)   Recent Labs Lab 12/13/15 1817 12/13/15 1826 12/13/15 2259 12/14/15 0542  WBC 17.8*  --   --  9.8  CREATININE 1.07*  --   --  0.94  LATICACIDVEN  --  0.96 1.03  --     Estimated Creatinine Clearance: 74.3 mL/min (by C-G formula based on Cr of 0.94).    Allergies  Allergen Reactions  . Shellfish Allergy Anaphylaxis  . Wellbutrin [Bupropion Hcl] Hives    Antimicrobials this admission: Ceftriaxone 3/09  Microbiology results: 3/10 UCx: pending 3/10 MRSA PCR: pending  Thank you for allowing pharmacy to be a part of this patient's care.Will sign off for now. No pharmacokinetic monitoring necessary.  Elder CyphersLorie Jaishon Krisher, BS Pharm D, [place] Clinical Pharmacist Pager [phone] 12/14/2015 10:28 AM

## 2015-12-14 NOTE — Care Management Note (Signed)
Case Management Note  Patient Details  Name: Courtney Ponce MRN: [id number] Date of Birth: 07/25/1976  Subjective/Objective:                  Pt is from home, admitted with UTI. Pt is uninsured and goes to the [facility] for medical services.   Action/Plan: Pt plans to return home with self care today. Pt abx in on the $4 list at [employer]. Pt's family picking her up from hospital. [facility]'s [facility]. FC made aware of pt uninsured status.    Expected Discharge Date:  12/16/15               Expected Discharge Plan:  Home/Self Care  In-House Referral:  Clinical Social Work, Museum/gallery exhibitions officerinancial Counselor  Discharge planning Services  CM Consult, Follow-up appt scheduled, [facility]  Post Acute Care Choice:  NA Choice offered to:  NA  DME Arranged:    DME Agency:     HH Arranged:    HH Agency:     Status of Service:  Completed, signed off  Medicare Important Message Given:    Date Medicare IM Given:    Medicare IM give by:    Date Additional Medicare IM Given:    Additional Medicare Important Message give by:     If discussed at Long Length of Stay Meetings, dates discussed:    Additional Comments:  Malcolm MetroChildress, Anylah Scheib Demske, RN 12/14/2015, 11:28 AM

## 2015-12-14 NOTE — Discharge Summary (Signed)
Physician Discharge Summary  Rubin PayorMindy S Ponce ZOX:[id number] DOB: 05/28/1976 DOA: 12/13/2015  PCP: No PCP Per Patient  Admit date: 12/13/2015 Discharge date: 12/14/2015  Time spent: 45 minutes  Recommendations for Outpatient Follow-up:  -Will be discharged home today. -Advised to follow-up with primary care provider in 2 weeks. -We will continue Cipro for 7 days.   Discharge Diagnoses:  Active Problems:   UTI (lower urinary tract infection)   Cocaine abuse   Discharge Condition: Stable and improved  Filed Weights   12/13/15 1744 12/13/15 2340  Weight: 84.823 kg (187 lb) 66.4 kg (146 lb 6.2 oz)    History of present illness:  As per Dr. Sharl MaLama on 623/389: 40 year old female who  has a past medical history of Anxiety; Depression; Abnormal Pap smear; GERD (gastroesophageal reflux disease); Neuromuscular disorder (HCC); and Chronic back pain. Today came to the ED with lower abdominal pain, and difficulty urination. Patient has history of cocaine abuse. Was given Narcan in the ED as she was lethargic. Patient now alert and oriented 3. Able to answer all questions. She denies chest pain or shortness of breath. No nausea or vomiting. Patient had constipation for a few days but started having loose bowel movements in the ED. UA showed numerous WBCs. Patient started on ceftriaxone  Hospital Course:   UTI -Was placed on Rocephin on admission. Urine culture was requested but is pending at time of discharge. -Patient is feeling well, afebrile without leukocytosis. -We'll discharge on 7 days of ciprofloxacin.  Cocaine abuse -Counseled on cessation.  Procedures:  None   Consultations:  None  Discharge Instructions  Discharge Instructions    Increase activity slowly    Complete by:  As directed             Medication List    STOP taking these medications        acetaminophen-codeine 300-30 MG tablet  Commonly known as:  TYLENOL #3     diazepam 10 MG tablet  Commonly  known as:  VALIUM      TAKE these medications        ciprofloxacin 500 MG tablet  Commonly known as:  CIPRO  Take 1 tablet (500 mg total) by mouth 2 (two) times daily.      ASK your doctor about these medications        ibuprofen 800 MG tablet  Commonly known as:  ADVIL,MOTRIN  Take 1 tablet (800 mg total) by mouth 3 (three) times daily.       Allergies  Allergen Reactions  . Shellfish Allergy Anaphylaxis  . Wellbutrin [Bupropion Hcl] Hives       Follow-up Information    Schedule an appointment as soon as possible for a visit in 2 weeks to follow up.   Why:  With your primary care provider       The results of significant diagnostics from this hospitalization (including imaging, microbiology, ancillary and laboratory) are listed below for reference.    Significant Diagnostic Studies: Dg Abd Acute W/chest  12/13/2015  CLINICAL DATA:  Patient complains of lower abdominal pain and nausea. States she vomited Tuesday. Pt states she has not had a BM in 2weeks. Current smoker. EXAM: DG ABDOMEN ACUTE W/ 1V CHEST COMPARISON:  08/19/2007 FINDINGS: Emphysema. Borderline enlargement of the cardiopericardial silhouette, without edema. No free intraperitoneal gas beneath the hemidiaphragms. No air-fluid levels. No dilated bowel. Unremarkable bowel gas pattern. IMPRESSION: 1. Emphysema. 2. Borderline enlargement of the cardiopericardial silhouette. Electronically Signed   By:  Gaylyn Rong M.D.   On: 12/13/2015 18:41    Microbiology: Recent Results (from the past 240 hour(s))  MRSA PCR Screening     Status: None   Collection Time: 12/14/15 12:10 AM  Result Value Ref Range Status   MRSA by PCR NEGATIVE NEGATIVE Final    Comment:        The GeneXpert MRSA Assay (FDA approved for NASAL specimens only), is one component of a comprehensive MRSA colonization surveillance program. It is not intended to diagnose MRSA infection nor to guide or monitor treatment for MRSA  infections.      Labs: Basic Metabolic Panel:  Recent Labs Lab 12/13/15 1817 12/14/15 0103 12/14/15 0542  NA 129*  --  135  K 2.8*  --  3.9  CL 98*  --  104  CO2 24  --  25  GLUCOSE 150*  --  137*  BUN 24*  --  18  CREATININE 1.07*  --  0.94  CALCIUM 7.6*  --  7.5*  MG  --  2.2  --    Liver Function Tests:  Recent Labs Lab 12/13/15 1817 12/14/15 0542  AST 17 21  ALT 12* 13*  ALKPHOS 73 75  BILITOT 0.8 0.3  PROT 6.1* 5.9*  ALBUMIN 2.3* 2.2*    Recent Labs Lab 12/13/15 1817  LIPASE 23   No results for input(s): AMMONIA in the last 168 hours. CBC:  Recent Labs Lab 12/13/15 1817 12/14/15 0542  WBC 17.8* 9.8  NEUTROABS 14.6*  --   HGB 11.3* 11.5*  HCT 33.2* 34.2*  MCV 93.5 95.0  PLT 153 126*   Cardiac Enzymes: No results for input(s): CKTOTAL, CKMB, CKMBINDEX, TROPONINI in the last 168 hours. BNP: BNP (last 3 results) No results for input(s): BNP in the last 8760 hours.  ProBNP (last 3 results) No results for input(s): PROBNP in the last 8760 hours.  CBG: No results for input(s): GLUCAP in the last 168 hours.     SignedChaya Jan  [facility] Pager: [phone] 12/14/2015, 11:02 AM

## 2015-12-14 NOTE — Progress Notes (Signed)
Patient discharged to home. Daughter and boyfriend came to transport home by car. All IV access removed and prescriptions given to patient. Rolled to car by wheelchair for transport.

## 2015-12-16 ENCOUNTER — Emergency Department (HOSPITAL_COMMUNITY)
Admission: EM | Admit: 2015-12-16 | Discharge: 2015-12-16 | Disposition: A | Discharge status: Home / Self Care | Payer: Self-pay | Attending: Emergency Medicine | Admitting: Emergency Medicine

## 2015-12-16 ENCOUNTER — Encounter (HOSPITAL_COMMUNITY): Payer: Self-pay | Admitting: Emergency Medicine

## 2015-12-16 DIAGNOSIS — F329 Major depressive disorder, single episode, unspecified: Secondary | ICD-10-CM | POA: Insufficient documentation

## 2015-12-16 DIAGNOSIS — F17210 Nicotine dependence, cigarettes, uncomplicated: Secondary | ICD-10-CM | POA: Insufficient documentation

## 2015-12-16 DIAGNOSIS — N3000 Acute cystitis without hematuria: Secondary | ICD-10-CM | POA: Insufficient documentation

## 2015-12-16 DIAGNOSIS — R110 Nausea: Secondary | ICD-10-CM | POA: Insufficient documentation

## 2015-12-16 DIAGNOSIS — Z79899 Other long term (current) drug therapy: Secondary | ICD-10-CM | POA: Insufficient documentation

## 2015-12-16 HISTORY — DX: Renal tubulo-interstitial disease, unspecified: N15.9

## 2015-12-16 LAB — URINALYSIS, ROUTINE W REFLEX MICROSCOPIC
BILIRUBIN URINE: NEGATIVE
GLUCOSE, UA: NEGATIVE mg/dL
Ketones, ur: NEGATIVE mg/dL
Nitrite: NEGATIVE
PH: 5.5 (ref 5.0–8.0)
Protein, ur: NEGATIVE mg/dL
SPECIFIC GRAVITY, URINE: 1.02 (ref 1.005–1.030)

## 2015-12-16 LAB — URINE MICROSCOPIC-ADD ON

## 2015-12-16 LAB — CBC WITH DIFFERENTIAL/PLATELET
BASOS ABS: 0 10*3/uL (ref 0.0–0.1)
BASOS PCT: 0 %
EOS ABS: 0 10*3/uL (ref 0.0–0.7)
EOS PCT: 1 %
HCT: 37 % (ref 36.0–46.0)
HEMOGLOBIN: 12 g/dL (ref 12.0–15.0)
Lymphocytes Relative: 23 %
Lymphs Abs: 1.5 10*3/uL (ref 0.7–4.0)
MCH: 31.2 pg (ref 26.0–34.0)
MCHC: 32.4 g/dL (ref 30.0–36.0)
MCV: 96.1 fL (ref 78.0–100.0)
Monocytes Absolute: 0.5 10*3/uL (ref 0.1–1.0)
Monocytes Relative: 7 %
NEUTROS PCT: 69 %
Neutro Abs: 4.6 10*3/uL (ref 1.7–7.7)
PLATELETS: 126 10*3/uL — AB (ref 150–400)
RBC: 3.85 MIL/uL — AB (ref 3.87–5.11)
RDW: 12.9 % (ref 11.5–15.5)
WBC: 6.5 10*3/uL (ref 4.0–10.5)

## 2015-12-16 LAB — COMPREHENSIVE METABOLIC PANEL
ALBUMIN: 2.2 g/dL — AB (ref 3.5–5.0)
ALT: 17 U/L (ref 14–54)
AST: 28 U/L (ref 15–41)
Alkaline Phosphatase: 78 U/L (ref 38–126)
Anion gap: 6 (ref 5–15)
BUN: 16 mg/dL (ref 6–20)
CHLORIDE: 106 mmol/L (ref 101–111)
CO2: 26 mmol/L (ref 22–32)
CREATININE: 0.76 mg/dL (ref 0.44–1.00)
Calcium: 8.1 mg/dL — ABNORMAL LOW (ref 8.9–10.3)
GFR calc Af Amer: >60 mL/min (ref >60)
GLUCOSE: 111 mg/dL — AB (ref 65–99)
Potassium: 4.3 mmol/L (ref 3.5–5.1)
SODIUM: 138 mmol/L (ref 135–145)
Total Bilirubin: 0.3 mg/dL (ref 0.3–1.2)
Total Protein: 5.9 g/dL — ABNORMAL LOW (ref 6.5–8.1)

## 2015-12-16 LAB — URINE CULTURE: Special Requests: NORMAL

## 2015-12-16 LAB — PREGNANCY, URINE: Preg Test, Ur: NEGATIVE

## 2015-12-16 LAB — LIPASE, BLOOD: LIPASE: 45 U/L (ref 11–51)

## 2015-12-16 MED ORDER — IBUPROFEN 400 MG PO TABS
600.0000 mg | ORAL_TABLET | Freq: Once | ORAL | Status: AC
  Administered 2015-12-16: 600 mg via ORAL
  Filled 2015-12-16: qty 2

## 2015-12-16 MED ORDER — CIPROFLOXACIN HCL 250 MG PO TABS
500.0000 mg | ORAL_TABLET | Freq: Two times a day (BID) | ORAL | Status: DC
Start: 2015-12-16 — End: 2015-12-16
  Administered 2015-12-16: 500 mg via ORAL
  Filled 2015-12-16: qty 2

## 2015-12-16 MED ORDER — CIPROFLOXACIN HCL 500 MG PO TABS: 500.0000 mg | ORAL_TABLET | Freq: Two times a day (BID) | ORAL | Status: DC

## 2015-12-16 NOTE — Discharge Instructions (Signed)
Please continue to take antibiotics as prescribed. Your blood work today does show improving infection. Return without fail for worsening symptoms including worsening pain, vomiting and unable to keep down food or fluids, fevers, or any other symptoms concerning to you.  Urinary Tract Infection A urinary tract infection (UTI) can occur any place along the urinary tract. The tract includes the kidneys, ureters, bladder, and urethra. A type of germ called bacteria often causes a UTI. UTIs are often helped with antibiotic medicine.  HOME CARE   If given, take antibiotics as told by your doctor. Finish them even if you start to feel better.  Drink enough fluids to keep your pee (urine) clear or pale yellow.  Avoid tea, drinks with caffeine, and bubbly (carbonated) drinks.  Pee often. Avoid holding your pee in for a long time.  Pee before and after having sex (intercourse).  Wipe from front to back after you poop (bowel movement) if you are a woman. Use each tissue only once. GET HELP RIGHT AWAY IF:   You have back pain.  You have lower belly (abdominal) pain.  You have chills.  You feel sick to your stomach (nauseous).  You throw up (vomit).  Your burning or discomfort with peeing does not go away.  You have a fever.  Your symptoms are not better in 3 days. MAKE SURE YOU:   Understand these instructions.  Will watch your condition.  Will get help right away if you are not doing well or get worse.   This information is not intended to replace advice given to you by your health care provider. Make sure you discuss any questions you have with your health care provider.   Document Released: 03/10/2008 Document Revised: 10/13/2014 Document Reviewed: 04/22/2012 Elsevier Interactive Patient Education [employer]2016 [employer]

## 2015-12-16 NOTE — ED Provider Notes (Signed)
CSN: [id number]     Arrival date & time 12/16/15  1438 History   First MD Initiated Contact with Patient 12/16/15 1529     Chief Complaint  Patient presents with  . Medication Refill     (Consider location/radiation/quality/duration/timing/severity/associated sxs/prior Treatment) HPI  40 year old female who presents with medication refill. Discharged 2 days ago after treatment for UTI and dehydration. She was discharged on ciprofloxacin, and states that she has been taking her antibiotics for the past 2 days, but has lost her antibiotics for today. Try to have this refilled at the pharmacy but was sent to the ED. States that she continues to have some suprapubic and left-sided abdominal pain since the diagnosis of her UTI and has been having some cold chills and occasionally feeling warm but not having any fevers. Occasional nausea but no vomiting or diarrhea.   Past Medical History  Diagnosis Date  . Anxiety   . Depression   . Abnormal Pap smear   . GERD (gastroesophageal reflux disease)   . Neuromuscular disorder (HCC)   . Chronic back pain   . Kidney infection    Past Surgical History  Procedure Laterality Date  . Wisdom tooth extraction    . Cystoscopy with urethral dilatation      age 89  . Colposcopy  2002   Family History  Problem Relation Age of Onset  . Cancer Mother     breast w/metastasis to liver,lung,bone, brain  . Hypertension Sister   . Thyroid disease Sister   . Cancer Maternal Aunt     ovarian w/metastasis to colon   Social History  Substance Use Topics  . Smoking status: Current Every Day Smoker -- 1.00 packs/day for 22 years    Types: Cigarettes  . Smokeless tobacco: Never Used  . Alcohol Use: Yes     Comment: socially   OB History    Gravida Para Term Preterm AB TAB SAB Ectopic Multiple Living   1 1 1       1      Review of Systems 10/14 systems reviewed and are negative other than those stated in the HPI   Allergies  Shellfish allergy and  Wellbutrin  Home Medications   Prior to Admission medications   Medication Sig Start Date End Date Taking? Authorizing Provider  ciprofloxacin (CIPRO) 500 MG tablet Take 1 tablet (500 mg total) by mouth 2 (two) times daily. 12/14/15  Yes Estela Isaiah BlakesY Hernandez Acosta, MD  ibuprofen (ADVIL,MOTRIN) 200 MG tablet Take 400 mg by mouth every 4 (four) hours as needed.   Yes Historical Provider, MD  ciprofloxacin (CIPRO) 500 MG tablet Take 1 tablet (500 mg total) by mouth every 12 (twelve) hours. 12/16/15   Lavera Guiseana Duo Fountain Derusha, MD   BP 109/72 mmHg  Pulse 54  Temp(Src) 97.8 F (36.6 C) (Oral)  Resp 18  Wt 146 lb (66.225 kg)  SpO2 100% Physical Exam Physical Exam  Nursing note and vitals reviewed. Constitutional: Well developed, well nourished, non-toxic, and in no acute distress Head: Normocephalic and atraumatic.  Mouth/Throat: Oropharynx is clear and moist.  Neck: Normal range of motion. Neck supple.  Cardiovascular: Normal rate and regular rhythm.   Pulmonary/Chest: Effort normal and breath sounds normal.  Abdominal: Soft. There is mild suprapubic and left lower abdominal tenderness. There is no rebound and no guarding. No significant CVA tenderness. Musculoskeletal: Normal range of motion.  Neurological: Alert, no facial droop, fluent speech, moves all extremities symmetrically Skin: Skin is warm and dry.  Psychiatric: Cooperative   ED Course  Procedures (including critical care time) Labs Review Labs Reviewed  CBC WITH DIFFERENTIAL/PLATELET - Abnormal; Notable for the following:    RBC 3.85 (*)    Platelets 126 (*)    All other components within normal limits  COMPREHENSIVE METABOLIC PANEL - Abnormal; Notable for the following:    Glucose, Bld 111 (*)    Calcium 8.1 (*)    Total Protein 5.9 (*)    Albumin 2.2 (*)    All other components within normal limits  URINALYSIS, ROUTINE W REFLEX MICROSCOPIC (NOT AT [facility]) - Abnormal; Notable for the following:    Hgb urine dipstick SMALL (*)     Leukocytes, UA TRACE (*)    All other components within normal limits  URINE MICROSCOPIC-ADD ON - Abnormal; Notable for the following:    Squamous Epithelial / LPF 0-5 (*)    Bacteria, UA RARE (*)    All other components within normal limits  LIPASE, BLOOD  PREGNANCY, URINE    Imaging Review No results found. I have personally reviewed and evaluated these images and lab results as part of my medical decision-making.   EKG Interpretation None      MDM   Final diagnoses:  Medication management  Acute cystitis without hematuria    40 year old female with recent history of urinary tract infection who presents after losing her outpatient antibiotics. On arrival is afebrile and hemodynamically stable. Is nontoxic and in no acute distress. No signs or symptoms of worsening infection or systemic illness. Has a soft and benign abdomen. Minimal suprapubic tenderness is noted. Blood work without significant leukocytosis and no other major electrolyte or metabolic derangements. Her urine does show some clearing of her infection. On review of her urine culture from her recent admission, her infection is sensitive to Cipro which she has been taking as an outpatient. I have prescribed her the remaining course of her ciprofloxacin. Strict return and follow-up instructions are reviewed. She expressed understanding of all discharge instructions, and felt comfortable with the plan of care.    Lavera Guise, MD 12/16/15 [phone]

## 2015-12-16 NOTE — ED Notes (Addendum)
Patient recently discharged from hospital for Kidney infection/sepsis. Patient discharged with Cipro. Per patient still has large amount of pain and swelling to left flank. Per patient had Cipro in purse today and strap broke-per patient medication muct have fell out and now she can not find it. Per patient she was told by pharmacy that she needed to come back to ER.

## 2016-01-11 ENCOUNTER — Encounter (HOSPITAL_COMMUNITY): Payer: Self-pay | Admitting: Family Medicine

## 2016-01-11 ENCOUNTER — Inpatient Hospital Stay (HOSPITAL_COMMUNITY)
Admission: EM | Admit: 2016-01-11 | Discharge: 2016-01-14 | DRG: 918 | Disposition: A | Discharge status: Other Acute Inpatient Hospital | Payer: Self-pay | Attending: Internal Medicine | Admitting: Internal Medicine

## 2016-01-11 DIAGNOSIS — B9689 Other specified bacterial agents as the cause of diseases classified elsewhere: Secondary | ICD-10-CM

## 2016-01-11 DIAGNOSIS — T50901A Poisoning by unspecified drugs, medicaments and biological substances, accidental (unintentional), initial encounter: Secondary | ICD-10-CM | POA: Insufficient documentation

## 2016-01-11 DIAGNOSIS — F329 Major depressive disorder, single episode, unspecified: Secondary | ICD-10-CM | POA: Diagnosis present

## 2016-01-11 DIAGNOSIS — F17210 Nicotine dependence, cigarettes, uncomplicated: Secondary | ICD-10-CM | POA: Diagnosis present

## 2016-01-11 DIAGNOSIS — T50902A Poisoning by unspecified drugs, medicaments and biological substances, intentional self-harm, initial encounter: Secondary | ICD-10-CM | POA: Diagnosis present

## 2016-01-11 DIAGNOSIS — H5500 Unspecified nystagmus: Secondary | ICD-10-CM | POA: Diagnosis present

## 2016-01-11 DIAGNOSIS — I4581 Long QT syndrome: Secondary | ICD-10-CM

## 2016-01-11 DIAGNOSIS — R9431 Abnormal electrocardiogram [ECG] [EKG]: Secondary | ICD-10-CM | POA: Diagnosis present

## 2016-01-11 DIAGNOSIS — F1210 Cannabis abuse, uncomplicated: Secondary | ICD-10-CM

## 2016-01-11 DIAGNOSIS — K219 Gastro-esophageal reflux disease without esophagitis: Secondary | ICD-10-CM | POA: Diagnosis present

## 2016-01-11 DIAGNOSIS — T43221A Poisoning by selective serotonin reuptake inhibitors, accidental (unintentional), initial encounter: Secondary | ICD-10-CM | POA: Diagnosis present

## 2016-01-11 DIAGNOSIS — Z888 Allergy status to other drugs, medicaments and biological substances status: Secondary | ICD-10-CM

## 2016-01-11 DIAGNOSIS — F419 Anxiety disorder, unspecified: Secondary | ICD-10-CM | POA: Diagnosis present

## 2016-01-11 DIAGNOSIS — X789XXA Intentional self-harm by unspecified sharp object, initial encounter: Secondary | ICD-10-CM | POA: Diagnosis present

## 2016-01-11 DIAGNOSIS — F1420 Cocaine dependence, uncomplicated: Secondary | ICD-10-CM | POA: Diagnosis present

## 2016-01-11 DIAGNOSIS — N390 Urinary tract infection, site not specified: Secondary | ICD-10-CM | POA: Diagnosis present

## 2016-01-11 DIAGNOSIS — E876 Hypokalemia: Secondary | ICD-10-CM | POA: Diagnosis present

## 2016-01-11 DIAGNOSIS — R062 Wheezing: Secondary | ICD-10-CM | POA: Insufficient documentation

## 2016-01-11 DIAGNOSIS — F1290 Cannabis use, unspecified, uncomplicated: Secondary | ICD-10-CM | POA: Diagnosis present

## 2016-01-11 DIAGNOSIS — T43211A Poisoning by selective serotonin and norepinephrine reuptake inhibitors, accidental (unintentional), initial encounter: Principal | ICD-10-CM | POA: Diagnosis present

## 2016-01-11 DIAGNOSIS — Y929 Unspecified place or not applicable: Secondary | ICD-10-CM

## 2016-01-11 DIAGNOSIS — Z91013 Allergy to seafood: Secondary | ICD-10-CM

## 2016-01-11 DIAGNOSIS — Z23 Encounter for immunization: Secondary | ICD-10-CM

## 2016-01-11 DIAGNOSIS — F1414 Cocaine abuse with cocaine-induced mood disorder: Secondary | ICD-10-CM | POA: Diagnosis present

## 2016-01-11 DIAGNOSIS — Z639 Problem related to primary support group, unspecified: Secondary | ICD-10-CM

## 2016-01-11 DIAGNOSIS — S1093XA Contusion of unspecified part of neck, initial encounter: Secondary | ICD-10-CM | POA: Diagnosis present

## 2016-01-11 LAB — CBC
HEMATOCRIT: 42.9 % (ref 36.0–46.0)
Hemoglobin: 13.8 g/dL (ref 12.0–15.0)
MCH: 30.9 pg (ref 26.0–34.0)
MCHC: 32.2 g/dL (ref 30.0–36.0)
MCV: 96 fL (ref 78.0–100.0)
Platelets: 165 10*3/uL (ref 150–400)
RBC: 4.47 MIL/uL (ref 3.87–5.11)
RDW: 13.2 % (ref 11.5–15.5)
WBC: 5.9 10*3/uL (ref 4.0–10.5)

## 2016-01-11 LAB — ACETAMINOPHEN LEVEL: Acetaminophen (Tylenol), Serum: <10 ug/mL — ABNORMAL LOW (ref 10–30)

## 2016-01-11 LAB — MAGNESIUM
MAGNESIUM: 2.4 mg/dL (ref 1.7–2.4)
Magnesium: 2.2 mg/dL (ref 1.7–2.4)
Magnesium: 2.4 mg/dL (ref 1.7–2.4)

## 2016-01-11 LAB — COMPREHENSIVE METABOLIC PANEL
ALBUMIN: 3.4 g/dL — AB (ref 3.5–5.0)
ALT: 21 U/L (ref 14–54)
AST: 24 U/L (ref 15–41)
Alkaline Phosphatase: 59 U/L (ref 38–126)
Anion gap: 9 (ref 5–15)
BILIRUBIN TOTAL: 0.8 mg/dL (ref 0.3–1.2)
BUN: 9 mg/dL (ref 6–20)
CHLORIDE: 102 mmol/L (ref 101–111)
CO2: 26 mmol/L (ref 22–32)
CREATININE: 0.65 mg/dL (ref 0.44–1.00)
Calcium: 9.1 mg/dL (ref 8.9–10.3)
GFR calc Af Amer: >60 mL/min (ref >60)
GLUCOSE: 113 mg/dL — AB (ref 65–99)
POTASSIUM: 3.4 mmol/L — AB (ref 3.5–5.1)
Sodium: 137 mmol/L (ref 135–145)
Total Protein: 7.3 g/dL (ref 6.5–8.1)

## 2016-01-11 LAB — RAPID URINE DRUG SCREEN, HOSP PERFORMED
AMPHETAMINES: NOT DETECTED
BARBITURATES: NOT DETECTED
BENZODIAZEPINES: NOT DETECTED
Cocaine: POSITIVE — AB
Opiates: NOT DETECTED
TETRAHYDROCANNABINOL: POSITIVE — AB

## 2016-01-11 LAB — URINE MICROSCOPIC-ADD ON

## 2016-01-11 LAB — I-STAT BETA HCG BLOOD, ED (MC, WL, AP ONLY)

## 2016-01-11 LAB — BASIC METABOLIC PANEL
ANION GAP: 10 (ref 5–15)
ANION GAP: 8 (ref 5–15)
BUN: 9 mg/dL (ref 6–20)
BUN: 10 mg/dL (ref 6–20)
CALCIUM: 8.3 mg/dL — AB (ref 8.9–10.3)
CALCIUM: 8.7 mg/dL — AB (ref 8.9–10.3)
CO2: 23 mmol/L (ref 22–32)
CO2: 25 mmol/L (ref 22–32)
Chloride: 105 mmol/L (ref 101–111)
Chloride: 106 mmol/L (ref 101–111)
Creatinine, Ser: 0.64 mg/dL (ref 0.44–1.00)
Creatinine, Ser: 0.71 mg/dL (ref 0.44–1.00)
GLUCOSE: 113 mg/dL — AB (ref 65–99)
GLUCOSE: 107 mg/dL — AB (ref 65–99)
POTASSIUM: 3.7 mmol/L (ref 3.5–5.1)
POTASSIUM: 4 mmol/L (ref 3.5–5.1)
Sodium: 138 mmol/L (ref 135–145)
Sodium: 139 mmol/L (ref 135–145)

## 2016-01-11 LAB — URINALYSIS, ROUTINE W REFLEX MICROSCOPIC
BILIRUBIN URINE: NEGATIVE
GLUCOSE, UA: NEGATIVE mg/dL
HGB URINE DIPSTICK: NEGATIVE
KETONES UR: NEGATIVE mg/dL
Nitrite: POSITIVE — AB
PH: 6 (ref 5.0–8.0)
Protein, ur: NEGATIVE mg/dL
Specific Gravity, Urine: 1.02 (ref 1.005–1.030)

## 2016-01-11 LAB — PHOSPHORUS: PHOSPHORUS: 2.4 mg/dL — AB (ref 2.5–4.6)

## 2016-01-11 LAB — ETHANOL: Alcohol, Ethyl (B): <5 mg/dL (ref <5)

## 2016-01-11 LAB — SALICYLATE LEVEL: Salicylate Lvl: <4 mg/dL (ref 2.8–30.0)

## 2016-01-11 MED ORDER — SODIUM CHLORIDE 0.9 % IV SOLN
INTRAVENOUS | Status: DC
  Administered 2016-01-11 – 2016-01-12 (×2): via INTRAVENOUS

## 2016-01-11 MED ORDER — SODIUM CHLORIDE 0.9% FLUSH
3.0000 mL | Freq: Two times a day (BID) | INTRAVENOUS | Status: DC
  Administered 2016-01-11 – 2016-01-14 (×4): 3 mL via INTRAVENOUS

## 2016-01-11 MED ORDER — DEXTROSE 5 % IV SOLN
1.0000 g | INTRAVENOUS | Status: DC
  Administered 2016-01-12: 1 g via INTRAVENOUS
  Filled 2016-01-11 (×2): qty 10

## 2016-01-11 MED ORDER — FOLIC ACID 1 MG PO TABS
1.0000 mg | ORAL_TABLET | Freq: Every day | ORAL | Status: DC
  Administered 2016-01-11 – 2016-01-14 (×4): 1 mg via ORAL
  Filled 2016-01-11 (×4): qty 1

## 2016-01-11 MED ORDER — POTASSIUM CHLORIDE CRYS ER 20 MEQ PO TBCR
40.0000 meq | EXTENDED_RELEASE_TABLET | Freq: Once | ORAL | Status: AC
  Administered 2016-01-11: 40 meq via ORAL
  Filled 2016-01-11: qty 2

## 2016-01-11 MED ORDER — DEXTROSE 5 % IV SOLN
1.0000 g | Freq: Once | INTRAVENOUS | Status: AC
  Administered 2016-01-11: 1 g via INTRAVENOUS
  Filled 2016-01-11: qty 10

## 2016-01-11 MED ORDER — ENOXAPARIN SODIUM 40 MG/0.4ML ~~LOC~~ SOLN
40.0000 mg | SUBCUTANEOUS | Status: DC
  Administered 2016-01-11 – 2016-01-13 (×3): 40 mg via SUBCUTANEOUS
  Filled 2016-01-11 (×3): qty 0.4

## 2016-01-11 MED ORDER — VITAMIN B-1 100 MG PO TABS
100.0000 mg | ORAL_TABLET | Freq: Every day | ORAL | Status: DC
  Administered 2016-01-11 – 2016-01-12 (×2): 100 mg via ORAL
  Filled 2016-01-11 (×3): qty 1

## 2016-01-11 MED ORDER — TETANUS-DIPHTH-ACELL PERTUSSIS 5-2.5-18.5 LF-MCG/0.5 IM SUSP
0.5000 mL | Freq: Once | INTRAMUSCULAR | Status: AC
  Administered 2016-01-11: 0.5 mL via INTRAMUSCULAR
  Filled 2016-01-11: qty 0.5

## 2016-01-11 MED ORDER — ADULT MULTIVITAMIN W/MINERALS CH
1.0000 | ORAL_TABLET | Freq: Every day | ORAL | Status: DC
Start: 2016-01-11 — End: 2016-01-14
  Administered 2016-01-11 – 2016-01-14 (×4): 1 via ORAL
  Filled 2016-01-11 (×4): qty 1

## 2016-01-11 MED ORDER — MAGNESIUM SULFATE 2 GM/50ML IV SOLN
2.0000 g | Freq: Once | INTRAVENOUS | Status: AC
  Administered 2016-01-11: 2 g via INTRAVENOUS
  Filled 2016-01-11: qty 50

## 2016-01-11 MED ORDER — LORAZEPAM 1 MG PO TABS: 1.0000 mg | ORAL_TABLET | Freq: Four times a day (QID) | ORAL | Status: DC | PRN

## 2016-01-11 MED ORDER — THIAMINE HCL 100 MG/ML IJ SOLN: 100.0000 mg | Freq: Every day | INTRAMUSCULAR | Status: DC

## 2016-01-11 MED ORDER — SODIUM CHLORIDE 0.9 % IV BOLUS (SEPSIS)
1000.0000 mL | Freq: Once | INTRAVENOUS | Status: AC
  Administered 2016-01-11: 1000 mL via INTRAVENOUS

## 2016-01-11 MED ORDER — LORAZEPAM 2 MG/ML IJ SOLN: 1.0000 mg | Freq: Four times a day (QID) | INTRAMUSCULAR | Status: DC | PRN

## 2016-01-11 NOTE — ED Notes (Signed)
Called Staffing for [employer]. No sitter available at this time.

## 2016-01-11 NOTE — ED Notes (Addendum)
Pt reports, "I took 120 Cymbalta and 60 Celexa not prescribed to me." Pt reports taking crack cocaine, "two peach xanax" and "two 10 mg of vicodin" the night before last. Pt is alert and Oriented x4. Pt remains on the cardiac monitor

## 2016-01-11 NOTE — ED Provider Notes (Signed)
CSN: [id number]     Arrival date & time 01/11/16  1028 History   First MD Initiated Contact with Patient 01/11/16 1045     Chief Complaint  Patient presents with  . Drug Overdose  . Suicidal     (Consider location/radiation/quality/duration/timing/severity/associated sxs/prior Treatment) HPI  Blood pressure 121/88, pulse 64, temperature 98 F (36.7 C), resp. rate 18, SpO2 97 %.  Courtney Ponce is a 40 y.o. female with past medical history significant for anxiety, depression, polysubstance abuse brought in by friend whom she told that she tried to commit suicide tonight before last by taking 120 Cymbalta and 60 Celexa, dosage are unknown, they were not prescribed to her, they were friends. Patient also been taking crack cocaine, to peak Xanax into 10 mg of Vicodin for last states that she has not been vomiting, she said that ever since she tried to kill herself she has just been sleeping. According to friend, she was clean for 3 years and recently relapsed. Patient denies pain, auditory or visual hallucinations, prior suicide attempts. Says she was recently admitted to the hospital for UTI, states that she has dysuria and urinary frequency with no hematuria or vaginal discharge, flank pain, nausea, vomiting, fever, chills   Past Medical History  Diagnosis Date  . Anxiety   . Depression   . Abnormal Pap smear   . GERD (gastroesophageal reflux disease)   . Neuromuscular disorder (HCC)   . Chronic back pain   . Kidney infection    Past Surgical History  Procedure Laterality Date  . Wisdom tooth extraction    . Cystoscopy with urethral dilatation      age 89  . Colposcopy  2002   Family History  Problem Relation Age of Onset  . Cancer Mother     breast w/metastasis to liver,lung,bone, brain  . Hypertension Sister   . Thyroid disease Sister   . Cancer Maternal Aunt     ovarian w/metastasis to colon   Social History  Substance Use Topics  . Smoking status: Current Every Day  Smoker -- 1.00 packs/day for 22 years    Types: Cigarettes  . Smokeless tobacco: Never Used  . Alcohol Use: Yes     Comment: socially   OB History    Gravida Para Term Preterm AB TAB SAB Ectopic Multiple Living   1 1 1       1      Review of Systems  10 systems reviewed and found to be negative, except as noted in the HPI.  Allergies  Shellfish allergy and Wellbutrin  Home Medications   Prior to Admission medications   Not on File   BP 130/85 mmHg  Pulse 59  Temp(Src) 98 F (36.7 C)  Resp 20  SpO2 97% Physical Exam  Constitutional: She is oriented to person, place, and time. She appears well-developed and well-nourished. No distress.  HENT:  Head: Normocephalic and atraumatic.  Mouth/Throat: Oropharynx is clear and moist.  Eyes: Conjunctivae and EOM are normal. Pupils are equal, round, and reactive to light.  Neck: Normal range of motion.  Cardiovascular: Normal rate, regular rhythm and intact distal pulses.   Pulmonary/Chest: Effort normal and breath sounds normal.  Abdominal: Soft. There is no tenderness.  Musculoskeletal: Normal range of motion.  Neurological: She is alert and oriented to person, place, and time.  Skin: She is not diaphoretic.  Partial thickness lacerations to left volar forearm with no warmth, tenderness palpation or discharge.  Psychiatric:  SI with attempt  Nursing note and vitals reviewed.   ED Course  Procedures (including critical care time) Labs Review Labs Reviewed  COMPREHENSIVE METABOLIC PANEL - Abnormal; Notable for the following:    Potassium 3.4 (*)    Glucose, Bld 113 (*)    Albumin 3.4 (*)    All other components within normal limits  ACETAMINOPHEN LEVEL - Abnormal; Notable for the following:    Acetaminophen (Tylenol), Serum <10 (*)    All other components within normal limits  URINE RAPID DRUG SCREEN, HOSP PERFORMED - Abnormal; Notable for the following:    Cocaine POSITIVE (*)    Tetrahydrocannabinol POSITIVE (*)     All other components within normal limits  URINALYSIS, ROUTINE W REFLEX MICROSCOPIC (NOT AT [facility]) - Abnormal; Notable for the following:    APPearance CLOUDY (*)    Nitrite POSITIVE (*)    Leukocytes, UA MODERATE (*)    All other components within normal limits  URINE MICROSCOPIC-ADD ON - Abnormal; Notable for the following:    Squamous Epithelial / LPF 0-5 (*)    Bacteria, UA MANY (*)    All other components within normal limits  PHOSPHORUS - Abnormal; Notable for the following:    Phosphorus 2.4 (*)    All other components within normal limits  URINE CULTURE  ETHANOL  SALICYLATE LEVEL  CBC  MAGNESIUM  I-STAT BETA HCG BLOOD, ED ([facility], [facility], [facility] ONLY)    Imaging Review No results found. I have personally reviewed and evaluated these images and lab results as part of my medical decision-making.   EKG Interpretation   Date/Time:  Friday January 11 2016 13:58:55 EDT Ventricular Rate:  62 PR Interval:  165 QRS Duration: 82 QT Interval:  506 QTC Calculation: 514 R Axis:   69 Text Interpretation:  Sinus rhythm Prolonged QT interval Confirmed by  Deretha Emory  MD, SCOTT ([id number]) on 01/11/2016 2:24:04 PM      MDM   Final diagnoses:  Overdose, intentional self-harm, initial encounter (HCC)  QT prolongation  UTI (lower urinary tract infection)    Filed Vitals:   01/11/16 1400 01/11/16 1401 01/11/16 1430 01/11/16 1500  BP: 135/99 135/99 144/105 130/85  Pulse: 62 61 61 59  Temp:      Resp: SpO2: 99% 100% 100% 97%    Medications  Tdap (BOOSTRIX) injection 0.5 mL (0.5 mLs Intramuscular Given 01/11/16 1134)  sodium chloride 0.9 % bolus 1,000 mL (0 mLs Intravenous Stopped 01/11/16 1237)  cefTRIAXone (ROCEPHIN) 1 g in dextrose 5 % 50 mL IVPB (0 g Intravenous Stopped 01/11/16 1430)  magnesium sulfate IVPB 2 g 50 mL (0 g Intravenous Stopped 01/11/16 1347)  potassium chloride SA (K-DUR,KLOR-CON) CR tablet 40 mEq (40 mEq Oral Given 01/11/16 1245)    Courtney Ponce is 40 y.o. female  presenting with Suicide attempt by before last, she overdosed on 120 Cymbalta and 60 Celexa, she also had a small amount of Percocet and Xanax. She was also smoking crack cocaine. Patient recently relapsed using crack cocaine.  Case discussed with poison control Revonda Standard. We discussed that her QTC is prolonged at 42, prior EKG from about a month ago it was 428. Recommends checking and repleting magnesium and potassium when necessary. Would normally recommend observation for 24 hours but it this patient has taken the medications when she states day before yesterday she is out of that window. QTc 482  Recheck EKG shows a QTC widening to 514 ms. Checked with the patient who is quite  sure that she took the medications at 0100 Thursday, she is clear that today is Friday. Case discussed with poison control who recommends optimizing her magnesium and potassium which have already been repleted and checking a phosphorus, they recommend repeating EKG and lites every 6 hours. Recommends that once the QRS starts to normalize you can recheck 8 hours later and then she can be cleared  Unassigned admission to teaching service. Case discussed with resident Dr. Yetta Barre, will be admitted under attending physician Dr. Criselda Peaches.     Wynetta Emery, PA-C 01/11/16 1526  Vanetta Mulders, MD 01/12/16 [phone]
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
## 2016-01-11 NOTE — ED Notes (Signed)
Dagoberto LigasJoan Robinson  [phone]    Pt verbalized this person to be allowed to obtain all information about patient's care.
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
## 2016-01-11 NOTE — ED Notes (Signed)
PA at the bedside.

## 2016-01-11 NOTE — ED Notes (Signed)
Expressed to patient that she has to take all of her clothes off except for her panties, and get into the purple scrubs. Laid out the belongings bag, with a patient sticker on front. Walked out to let patient get dressed. The visitor with the patient notified me when patient was done getting dressed. The visitor confirmed that everything except for panties are off. Had patient get into wheelchair to go get the urine. Wheeled patient to bathroom, told her to pull the alarm when finished. Patient pulled alarm at 10:58. Wheeled patient back to room. Told patient security would be here shortly. Walked out of patients room to put a patient sticker on urine. Put urine in a red bag. Called security at 11:03a to wand patient. Amy walked urine to lab.

## 2016-01-11 NOTE — ED Notes (Addendum)
Pt here sts that she took overdosed on 150 pills 2 nights ago. sts Celexa and Cymbalta. sts she is suicidal. sts also she has been using crack cocaine, pain pills and xanax. Pt has cuts on her arms.

## 2016-01-11 NOTE — ED Notes (Signed)
Handed EKG to Dr. Deretha EmoryZackowski

## 2016-01-12 ENCOUNTER — Observation Stay (HOSPITAL_COMMUNITY): Payer: Self-pay

## 2016-01-12 DIAGNOSIS — T43212A Poisoning by selective serotonin and norepinephrine reuptake inhibitors, intentional self-harm, initial encounter: Secondary | ICD-10-CM

## 2016-01-12 DIAGNOSIS — T43222A Poisoning by selective serotonin reuptake inhibitors, intentional self-harm, initial encounter: Secondary | ICD-10-CM

## 2016-01-12 DIAGNOSIS — F1414 Cocaine abuse with cocaine-induced mood disorder: Secondary | ICD-10-CM | POA: Diagnosis present

## 2016-01-12 DIAGNOSIS — R45851 Suicidal ideations: Secondary | ICD-10-CM

## 2016-01-12 DIAGNOSIS — T50902A Poisoning by unspecified drugs, medicaments and biological substances, intentional self-harm, initial encounter: Secondary | ICD-10-CM | POA: Diagnosis present

## 2016-01-12 DIAGNOSIS — T1491 Suicide attempt: Secondary | ICD-10-CM

## 2016-01-12 DIAGNOSIS — F1420 Cocaine dependence, uncomplicated: Secondary | ICD-10-CM | POA: Diagnosis present

## 2016-01-12 LAB — BASIC METABOLIC PANEL
ANION GAP: 8 (ref 5–15)
BUN: 10 mg/dL (ref 6–20)
CHLORIDE: 106 mmol/L (ref 101–111)
CO2: 25 mmol/L (ref 22–32)
CREATININE: 0.75 mg/dL (ref 0.44–1.00)
Calcium: 8.4 mg/dL — ABNORMAL LOW (ref 8.9–10.3)
GFR calc non Af Amer: >60 mL/min (ref >60)
Glucose, Bld: 98 mg/dL (ref 65–99)
POTASSIUM: 4.5 mmol/L (ref 3.5–5.1)
SODIUM: 139 mmol/L (ref 135–145)

## 2016-01-12 LAB — MAGNESIUM
MAGNESIUM: 2.2 mg/dL (ref 1.7–2.4)
Magnesium: 2.1 mg/dL (ref 1.7–2.4)

## 2016-01-12 LAB — CBC
HEMATOCRIT: 42.3 % (ref 36.0–46.0)
Hemoglobin: 13.5 g/dL (ref 12.0–15.0)
MCH: 30.8 pg (ref 26.0–34.0)
MCHC: 31.9 g/dL (ref 30.0–36.0)
MCV: 96.6 fL (ref 78.0–100.0)
Platelets: 156 10*3/uL (ref 150–400)
RBC: 4.38 MIL/uL (ref 3.87–5.11)
RDW: 13.2 % (ref 11.5–15.5)
WBC: 5.4 10*3/uL (ref 4.0–10.5)

## 2016-01-12 MED ORDER — NITROFURANTOIN MONOHYD MACRO 100 MG PO CAPS: 100.0000 mg | ORAL_CAPSULE | Freq: Two times a day (BID) | ORAL | Status: DC

## 2016-01-12 NOTE — Consult Note (Signed)
[facility] Consult   Reason for Consult:  Suicide attempt by overdose, Cocaine dependence Referring Physician:  Dr. Marijean Bravo Patient Identification: Courtney Ponce MRN:  [id number] Principal Diagnosis: Suicide attempt by drug ingestion [facility]) Diagnosis:   Patient Active Problem List   Diagnosis Date Noted  . Cocaine use disorder, severe, dependence ([place]) [F14.20] 01/12/2016    Priority: High  . Cocaine abuse with cocaine-induced mood disorder [facility]) [F14.14] 01/12/2016    Priority: High  . Suicide attempt by drug ingestion ([place]) [T50.902A] 01/12/2016    Priority: High  . QT prolongation [I45.81] 01/11/2016  . SSRI overdose [T43.221A] 01/11/2016  . Overdose [T50.901A]   . Wheezing [R06.2]   . UTI (lower urinary tract infection) [N39.0] 12/13/2015  . Cocaine abuse [F14.10] 12/13/2015  . Muscle weakness (generalized) [M62.81] 12/07/2013  . Lack of coordination [R27.9] 12/07/2013  . Wrist fracture, right [S62.101A] 11/24/2013  . Right wrist fracture [S62.101A] 10/17/2013  . Distal radius fracture [S52.509A] 10/10/2013    Total Time spent with patient: 1 hour  Subjective:   Courtney Ponce is a 40 y.o. female patient admitted after she attempted suicide by overdosing on Cymbalta and Celexa.  HPI: Thanks for asking me to do a psychiatric consultation on Courtney Ponce. She reports history of drug addiction, depression and Anxiety. Patient reports she has been stressed out and overwhelmed with life lately. She presents to the hospital after she overdosed on 60 pills of Celexa and 120 pills of Cymbalta, old medications that belongs to her deceased mother. Patient reports long history of Cocaine addiction, using since age 61 but was cleaned for 3 years and relapsed about 6 months ago. She quit her job at [employer] and moved back home in [place], [place] to live with family.  Patient reports that she has been going on crack cocaine binge with increased depression and mood swings. States that  she felt like her life is quickly relapsing into shamble and as a result made a resolution to kill herself 2 nights ago by OD. Patient denies prior history psychosis or delusional thinking but endorsed one suicide attempt in the past, taking "a bunch of pills" when she was a teenager. Currently, she verbalizes neurovegetative symptoms of depression: anhedonia, hopelessness, psychomotor agitation, sleep disturbance, and impaired concentration. She has been engaging in self harming(cutting) behavior to relieve emotional pain. She reports getting "high" on cocaine and  uses cannabis and xanax "to come down."  She also smokes 1.5 ppd and occasionally binge drinks alcohol. Patient is requesting for drug detoxification, she is unable to commit for safety. She has history of drug rehab at [facility].  Past Psychiatric History: Polysubstance abuse. Depression  Risk to Self: Is patient at risk for suicide?: yes Risk to Others:   Prior Inpatient Therapy:   Prior Outpatient Therapy:    Past Medical History:  Past Medical History  Diagnosis Date  . Anxiety   . Depression   . Abnormal Pap smear   . GERD (gastroesophageal reflux disease)   . Neuromuscular disorder ([place])   . Chronic back pain   . Kidney infection     Past Surgical History  Procedure Laterality Date  . Wisdom tooth extraction    . Cystoscopy with urethral dilatation      age 24  . Colposcopy  2002   Family History:  Family History  Problem Relation Age of Onset  . Cancer Mother     breast w/metastasis to liver,lung,bone, brain  . Hypertension Sister   . Thyroid  disease Sister   . Cancer Maternal Aunt     ovarian w/metastasis to colon   Family Psychiatric  History: depression Social History:  History  Alcohol Use  . Yes    Comment: socially     History  Drug Use  . Yes  . Special: Cocaine    Comment: lost 2 weeks ago    Social History   Social History  . Marital Status: Single    Spouse Name: N/A  . Number of Children:  N/A  . Years of Education: N/A   Social History Main Topics  . Smoking status: Current Every Day Smoker -- 1.00 packs/day for 22 years    Types: Cigarettes  . Smokeless tobacco: Never Used  . Alcohol Use: Yes     Comment: socially  . Drug Use: Yes    Special: Cocaine     Comment: lost 2 weeks ago  . Sexual Activity: Yes    Birth Control/ Protection: Injection   Other Topics Concern  . None   Social History Narrative   Additional Social History:    Allergies:   Allergies  Allergen Reactions  . Shellfish Allergy Anaphylaxis  . Wellbutrin [Bupropion Hcl] Hives    Labs:  Results for orders placed or performed during the hospital encounter of 01/11/16 (from the past 48 hour(s))  Comprehensive metabolic panel     Status: Abnormal   Collection Time: 01/11/16 10:40 AM  Result Value Ref Range   Sodium 137 135 - 145 mmol/L   Potassium 3.4 (L) 3.5 - 5.1 mmol/L   Chloride 102 101 - 111 mmol/L   CO2 26 22 - 32 mmol/L   Glucose, Bld 113 (H) 65 - 99 mg/dL   BUN 9 6 - 20 mg/dL   Creatinine, Ser 0.65 0.44 - 1.00 mg/dL   Calcium 9.1 8.9 - 10.3 mg/dL   Total Protein 7.3 6.5 - 8.1 g/dL   Albumin 3.4 (L) 3.5 - 5.0 g/dL   AST 24 15 - 41 U/L   ALT 21 14 - 54 U/L   Alkaline Phosphatase 59 38 - 126 U/L   Total Bilirubin 0.8 0.3 - 1.2 mg/dL   GFR calc non Af Amer >60 >60 mL/min   GFR calc Af Amer >60 >60 mL/min    Comment: (NOTE) The eGFR has been calculated using the CKD EPI equation. This calculation has not been validated in all clinical situations. eGFR's persistently <60 mL/min signify possible Chronic Kidney Disease.    Anion gap 9 5 - 15  Ethanol (ETOH)     Status: None   Collection Time: 01/11/16 10:40 AM  Result Value Ref Range   Alcohol, Ethyl (B) <5 <5 mg/dL    Comment:        LOWEST DETECTABLE LIMIT FOR SERUM ALCOHOL IS 5 mg/dL FOR MEDICAL PURPOSES ONLY REPEATED TO VERIFY   Salicylate level     Status: None   Collection Time: 01/11/16 10:40 AM  Result Value Ref  Range   Salicylate Lvl <9.5 2.8 - 30.0 mg/dL  Acetaminophen level     Status: Abnormal   Collection Time: 01/11/16 10:40 AM  Result Value Ref Range   Acetaminophen (Tylenol), Serum <10 (L) 10 - 30 ug/mL    Comment:        THERAPEUTIC CONCENTRATIONS VARY SIGNIFICANTLY. A RANGE OF 10-30 ug/mL MAY BE AN EFFECTIVE CONCENTRATION FOR MANY PATIENTS. HOWEVER, SOME ARE BEST TREATED AT CONCENTRATIONS OUTSIDE THIS RANGE. ACETAMINOPHEN CONCENTRATIONS >150 ug/mL AT 4 HOURS AFTER INGESTION AND >  50 ug/mL AT 12 HOURS AFTER INGESTION ARE OFTEN ASSOCIATED WITH TOXIC REACTIONS.   CBC     Status: None   Collection Time: 01/11/16 10:40 AM  Result Value Ref Range   WBC 5.9 4.0 - 10.5 K/uL   RBC 4.47 3.87 - 5.11 MIL/uL   Hemoglobin 13.8 12.0 - 15.0 g/dL   HCT 42.9 36.0 - 46.0 %   MCV 96.0 78.0 - 100.0 fL   MCH 30.9 26.0 - 34.0 pg   MCHC 32.2 30.0 - 36.0 g/dL   RDW 13.2 11.5 - 15.5 %   Platelets 165 150 - 400 K/uL  Magnesium     Status: None   Collection Time: 01/11/16 10:40 AM  Result Value Ref Range   Magnesium 2.2 1.7 - 2.4 mg/dL  Phosphorus     Status: Abnormal   Collection Time: 01/11/16 10:40 AM  Result Value Ref Range   Phosphorus 2.4 (L) 2.5 - 4.6 mg/dL  I-Stat Beta hCG blood, ED ([facility], [facility], [facility] only)     Status: None   Collection Time: 01/11/16 10:56 AM  Result Value Ref Range   I-stat hCG, quantitative <5.0 <5 mIU/mL   Comment 3            Comment:   GEST. AGE      CONC.  (mIU/mL)   <=1 WEEK        5 - 50     2 WEEKS       50 - 500     3 WEEKS       100 - 10,000     4 WEEKS     1,000 - 30,000        FEMALE AND NON-PREGNANT FEMALE:     LESS THAN 5 mIU/mL   Urine rapid drug screen (hosp performed) (Not at [facility])     Status: Abnormal   Collection Time: 01/11/16 11:00 AM  Result Value Ref Range   Opiates NONE DETECTED NONE DETECTED   Cocaine POSITIVE (A) NONE DETECTED   Benzodiazepines NONE DETECTED NONE DETECTED   Amphetamines NONE DETECTED NONE DETECTED   Tetrahydrocannabinol  POSITIVE (A) NONE DETECTED   Barbiturates NONE DETECTED NONE DETECTED    Comment:        DRUG SCREEN FOR MEDICAL PURPOSES ONLY.  IF CONFIRMATION IS NEEDED FOR ANY PURPOSE, NOTIFY LAB WITHIN 5 DAYS.        LOWEST DETECTABLE LIMITS FOR URINE DRUG SCREEN Drug Class       Cutoff (ng/mL) Amphetamine      1000 Barbiturate      200 Benzodiazepine   638 Tricyclics       756 Opiates          300 Cocaine          300 THC              50   Urinalysis, Routine w reflex microscopic (not at [facility])     Status: Abnormal   Collection Time: 01/11/16 11:00 AM  Result Value Ref Range   Color, Urine YELLOW YELLOW   APPearance CLOUDY (A) CLEAR   Specific Gravity, Urine 1.020 1.005 - 1.030   pH 6.0 5.0 - 8.0   Glucose, UA NEGATIVE NEGATIVE mg/dL   Hgb urine dipstick NEGATIVE NEGATIVE   Bilirubin Urine NEGATIVE NEGATIVE   Ketones, ur NEGATIVE NEGATIVE mg/dL   Protein, ur NEGATIVE NEGATIVE mg/dL   Nitrite POSITIVE (A) NEGATIVE   Leukocytes, UA MODERATE (A) NEGATIVE  Urine microscopic-add on  Status: Abnormal   Collection Time: 01/11/16 11:00 AM  Result Value Ref Range   Squamous Epithelial / LPF 0-5 (A) NONE SEEN   WBC, UA TOO NUMEROUS TO COUNT 0 - 5 WBC/hpf   RBC / HPF 0-5 0 - 5 RBC/hpf   Bacteria, UA MANY (A) NONE SEEN  Basic metabolic panel     Status: Abnormal   Collection Time: 01/11/16  5:15 PM  Result Value Ref Range   Sodium 138 135 - 145 mmol/L   Potassium 3.7 3.5 - 5.1 mmol/L   Chloride 105 101 - 111 mmol/L   CO2 23 22 - 32 mmol/L   Glucose, Bld 113 (H) 65 - 99 mg/dL   BUN 9 6 - 20 mg/dL   Creatinine, Ser 0.64 0.44 - 1.00 mg/dL   Calcium 8.3 (L) 8.9 - 10.3 mg/dL   GFR calc non Af Amer >60 >60 mL/min   GFR calc Af Amer >60 >60 mL/min    Comment: (NOTE) The eGFR has been calculated using the CKD EPI equation. This calculation has not been validated in all clinical situations. eGFR's persistently <60 mL/min signify possible Chronic Kidney Disease.    Anion gap 10 5 - 15   Magnesium     Status: None   Collection Time: 01/11/16  5:15 PM  Result Value Ref Range   Magnesium 2.4 1.7 - 2.4 mg/dL  Basic metabolic panel     Status: Abnormal   Collection Time: 01/11/16 10:41 PM  Result Value Ref Range   Sodium 139 135 - 145 mmol/L   Potassium 4.0 3.5 - 5.1 mmol/L   Chloride 106 101 - 111 mmol/L   CO2 25 22 - 32 mmol/L   Glucose, Bld 107 (H) 65 - 99 mg/dL   BUN 10 6 - 20 mg/dL   Creatinine, Ser 0.71 0.44 - 1.00 mg/dL   Calcium 8.7 (L) 8.9 - 10.3 mg/dL   GFR calc non Af Amer >60 >60 mL/min   GFR calc Af Amer >60 >60 mL/min    Comment: (NOTE) The eGFR has been calculated using the CKD EPI equation. This calculation has not been validated in all clinical situations. eGFR's persistently <60 mL/min signify possible Chronic Kidney Disease.    Anion gap 8 5 - 15  Magnesium     Status: None   Collection Time: 01/11/16 10:41 PM  Result Value Ref Range   Magnesium 2.4 1.7 - 2.4 mg/dL  Basic metabolic panel     Status: Abnormal   Collection Time: 01/12/16  4:00 AM  Result Value Ref Range   Sodium 139 135 - 145 mmol/L   Potassium 4.5 3.5 - 5.1 mmol/L   Chloride 106 101 - 111 mmol/L   CO2 25 22 - 32 mmol/L   Glucose, Bld 98 65 - 99 mg/dL   BUN 10 6 - 20 mg/dL   Creatinine, Ser 0.75 0.44 - 1.00 mg/dL   Calcium 8.4 (L) 8.9 - 10.3 mg/dL   GFR calc non Af Amer >60 >60 mL/min   GFR calc Af Amer >60 >60 mL/min    Comment: (NOTE) The eGFR has been calculated using the CKD EPI equation. This calculation has not been validated in all clinical situations. eGFR's persistently <60 mL/min signify possible Chronic Kidney Disease.    Anion gap 8 5 - 15  Magnesium     Status: None   Collection Time: 01/12/16  4:00 AM  Result Value Ref Range   Magnesium 2.2 1.7 - 2.4 mg/dL  CBC  Status: None   Collection Time: 01/12/16  4:00 AM  Result Value Ref Range   WBC 5.4 4.0 - 10.5 K/uL   RBC 4.38 3.87 - 5.11 MIL/uL   Hemoglobin 13.5 12.0 - 15.0 g/dL   HCT 42.3 36.0 -  46.0 %   MCV 96.6 78.0 - 100.0 fL   MCH 30.8 26.0 - 34.0 pg   MCHC 31.9 30.0 - 36.0 g/dL   RDW 13.2 11.5 - 15.5 %   Platelets 156 150 - 400 K/uL  Magnesium     Status: None   Collection Time: 01/12/16 10:25 AM  Result Value Ref Range   Magnesium 2.1 1.7 - 2.4 mg/dL    Current Facility-Administered Medications  Medication Dose Route Frequency Provider Last Rate Last Dose  . 0.9 %  sodium chloride infusion   Intravenous Continuous Corky Sox, MD 100 mL/hr at 01/12/16 1000    . cefTRIAXone (ROCEPHIN) 1 g in dextrose 5 % 50 mL IVPB  1 g Intravenous Q24H Corky Sox, MD   1 g at 01/12/16 1154  . enoxaparin (LOVENOX) injection 40 mg  40 mg Subcutaneous Q24H Corky Sox, MD   40 mg at 01/11/16 1856  . folic acid (FOLVITE) tablet 1 mg  1 mg Oral Daily Corky Sox, MD   1 mg at 01/12/16 1046  . LORazepam (ATIVAN) tablet 1 mg  1 mg Oral Q6H PRN Corky Sox, MD       Or  . LORazepam (ATIVAN) injection 1 mg  1 mg Intravenous Q6H PRN Corky Sox, MD      . multivitamin with minerals tablet 1 tablet  1 tablet Oral Daily Corky Sox, MD   1 tablet at 01/12/16 1046  . sodium chloride flush (NS) 0.9 % injection 3 mL  3 mL Intravenous Q12H Corky Sox, MD   3 mL at 01/11/16 2200  . thiamine (VITAMIN B-1) tablet 100 mg  100 mg Oral Daily Corky Sox, MD   100 mg at 01/12/16 1046   Or  . thiamine (B-1) injection 100 mg  100 mg Intravenous Daily Corky Sox, MD        Musculoskeletal: Strength & Muscle Tone: within normal limits Gait & Station: unsteady Patient leans: Front  Psychiatric Specialty Exam: Review of Systems  Constitutional: Positive for malaise/fatigue.  HENT: Negative.   Eyes: Negative.   Respiratory: Negative.   Cardiovascular: Negative.   Gastrointestinal: Positive for nausea.  Genitourinary: Negative.   Skin: Negative.   Neurological: Positive for weakness.  Endo/Heme/Allergies: Negative.   Psychiatric/Behavioral: Positive for depression, suicidal ideas and  substance abuse. The patient is nervous/anxious.     Blood pressure 129/81, pulse 63, temperature 98.8 F (37.1 C), temperature source Oral, resp. rate 18, height '5\' 3"'$  (1.6 m), weight 61.236 kg (135 lb), SpO2 99 %.Body mass index is 23.92 kg/(m^2).  General Appearance: Casual  Eye Contact::  Minimal  Speech:  Clear and Coherent  Volume:  Decreased  Mood:  Depressed  Affect:  Constricted  Thought Process:  Goal Directed  Orientation:  Full (Time, Place, and Person)  Thought Content:  Negative  Suicidal Thoughts:  Yes.  without intent/plan  Homicidal Thoughts:  No  Memory:  Immediate;   Good Recent;   Good Remote;   Good  Judgement:  Impaired  Insight:  Lacking  Psychomotor Activity:  Decreased  Concentration:  Fair  Recall:  Good  Fund of Knowledge:Good  Language: Good  Akathisia:  No  Handed:  Right  AIMS (if indicated):     Assets:  Communication Skills Desire for Improvement  ADL's:  Intact  Cognition: WNL  Sleep:   poor   Treatment Plan Summary: Daily contact with patient to assess and evaluate symptoms and progress in treatment.  Medication management and Plan: -Crisis stabilization. -Hold antidepressant due to prolong QT interval secondary to Overdose. -May consider antidepressant when EKG is normal.  Disposition: Recommend psychiatric Inpatient admission when medically cleared. Supportive therapy provided about ongoing stressors. Unit social worker to assist in placing patient at inpatient psychiatric hospital for stabilization: preferably dual diagnosis unit.  Courtney Pilgrim, MD 01/12/2016 1:51 PM

## 2016-01-12 NOTE — Discharge Instructions (Signed)
The pills you took messed with the electrical activity of your heart. We tracked your heart and this electrical activity improved. We expect you to make a full recovery from this overdose  We noted that you had a urinary tract infection during your hospitalization. We gave you antibiotics. At your new facility, you will take oral antibiotics twice a day until the pills run out.  We wish you best of luck on your road to healing and recovery.
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
## 2016-01-13 LAB — URINE CULTURE

## 2016-01-13 MED ORDER — NITROFURANTOIN MONOHYD MACRO 100 MG PO CAPS
100.0000 mg | ORAL_CAPSULE | Freq: Two times a day (BID) | ORAL | Status: DC
  Administered 2016-01-13 – 2016-01-14 (×3): 100 mg via ORAL
  Filled 2016-01-13 (×4): qty 1

## 2016-01-13 MED ORDER — NITROFURANTOIN MONOHYD MACRO 100 MG PO CAPS: 100.0000 mg | ORAL_CAPSULE | Freq: Two times a day (BID) | ORAL | Status: DC

## 2016-01-13 NOTE — Progress Notes (Signed)
Patient ID: Courtney Ponce, female   DOB: 05/14/1976, 40 y.o.   MRN: [id number]   Subjective: Ms. Nunzio CobbsBobbitt is doing well today; she says her eyes feel "twitchy" but taking all of the SSRIs but has no other complaints. We talked about the circumstances of her suicide attempt. She feels regretful and denies any further suicidal ideation. She's very interested in going to inpatient psychiatric rehab. Her dysuria has resolved.  Objective: Vital signs in last 24 hours: Filed Vitals:   01/12/16 0642 01/12/16 1300 01/12/16 2024 01/13/16 0501  BP:  129/81 126/76 130/79  Pulse:   56 55  Temp:  98.8 F (37.1 C) 97.9 F (36.6 C) 98.5 F (36.9 C)  TempSrc:  Oral Oral   Resp:   16 17  Height:      Weight: 135 lb (61.236 kg)   136 lb 6.4 oz (61.871 kg)  SpO2:   100% 99%   General: resting in bed comfortably, appropriately conversational, good insight, no SI Cardiac: regular rate and rhythm, no rubs, murmurs or gallops Pulm: breathing well, clear to auscultation bilaterally Abd: bowel sounds normal, soft, nondistended, non-tender, no CVA tenderness Ext: warm and well perfused, without pedal edema, Neuro: no myoclonus, nystagmus, or hyperreflexia, alert and oriented X3, cranial nerves II-XII grossly intact, moving all extremities well  Lab Results: Micro Results: Recent Results (from the past 240 hour(s))  Urine culture     Status: Abnormal (Preliminary result)   Collection Time: 01/11/16 11:00 AM  Result Value Ref Range Status   Specimen Description URINE, CLEAN CATCH  Final   Special Requests NONE  Final   Culture >=100,000 COLONIES/mL GRAM NEGATIVE RODS (A)  Final   Report Status PENDING  Incomplete    Medications: I have reviewed the patient's current medications. Scheduled Meds: . cefTRIAXone (ROCEPHIN) IVPB 1 gram/50 mL D5W  1 g Intravenous Q24H  . enoxaparin (LOVENOX) injection  40 mg Subcutaneous Q24H  . folic acid  1 mg Oral Daily  . multivitamin with minerals  1 tablet Oral Daily   . sodium chloride flush  3 mL Intravenous Q12H  . thiamine  100 mg Oral Daily   Or  . thiamine  100 mg Intravenous Daily   Continuous Infusions: . sodium chloride 100 mL/hr at 01/12/16 2220   PRN Meds:.LORazepam **OR** LORazepam   Assessment/Plan:  SSRI/SNRI Overdose Suicide Attempt: She is medically-cleared for discharge. She is not clinically in serotonin syndrome, her electrolytes are normal, and her QTc is normal. -She is medically-cleared for discharge to inpatient psychiatry today  Uncomplicated urinary tract infection: Her symptoms have resolved with ceftriaxone. No fevers nor flank-tenderness. -Will discharge with 3 more days of nitrofurantoin  Dispo: Discharge to inpatient psychiatry today.  The patient does not have a current PCP (No Pcp Per Patient) and [facility] after discharge.  The patient does not know have transportation limitations that hinder transportation to clinic appointments.  .Services Needed at time of discharge: Y = Yes, Blank = No PT:   OT:   RN:   Equipment:   Other:     LOS: 1 day   Selina CooleyKyle Meara Wiechman, MD 01/13/2016, 9:22 AM

## 2016-01-13 NOTE — Progress Notes (Signed)
Disposition CSW completed patient referrals to the following inpatient psych facilities:  [facility]   Patient [facility] be a [facility], there are none available at this time.  CSW will continue to follow patient for placement needs.  Seward SpeckLeo Avis Tirone [facility]oreCSW,LCAS Behavioral Health Disposition CSW [phone]

## 2016-01-14 ENCOUNTER — Inpatient Hospital Stay (HOSPITAL_COMMUNITY)
Admission: AD | Admit: 2016-01-14 | Discharge: 2016-01-20 | DRG: 885 | Disposition: A | Discharge status: Home / Self Care | Payer: No Typology Code available for payment source | Source: Intra-hospital | Attending: Psychiatry | Admitting: Psychiatry

## 2016-01-14 ENCOUNTER — Encounter (HOSPITAL_COMMUNITY): Payer: Self-pay

## 2016-01-14 DIAGNOSIS — F322 Major depressive disorder, single episode, severe without psychotic features: Secondary | ICD-10-CM | POA: Diagnosis not present

## 2016-01-14 DIAGNOSIS — I4581 Long QT syndrome: Secondary | ICD-10-CM | POA: Diagnosis present

## 2016-01-14 DIAGNOSIS — F17210 Nicotine dependence, cigarettes, uncomplicated: Secondary | ICD-10-CM | POA: Diagnosis present

## 2016-01-14 DIAGNOSIS — T43212A Poisoning by selective serotonin and norepinephrine reuptake inhibitors, intentional self-harm, initial encounter: Secondary | ICD-10-CM | POA: Diagnosis not present

## 2016-01-14 DIAGNOSIS — T1491 Suicide attempt: Secondary | ICD-10-CM | POA: Diagnosis not present

## 2016-01-14 DIAGNOSIS — T1491XA Suicide attempt, initial encounter: Secondary | ICD-10-CM | POA: Diagnosis present

## 2016-01-14 DIAGNOSIS — F1424 Cocaine dependence with cocaine-induced mood disorder: Secondary | ICD-10-CM | POA: Diagnosis present

## 2016-01-14 DIAGNOSIS — T43222A Poisoning by selective serotonin reuptake inhibitors, intentional self-harm, initial encounter: Secondary | ICD-10-CM | POA: Diagnosis not present

## 2016-01-14 DIAGNOSIS — F1414 Cocaine abuse with cocaine-induced mood disorder: Secondary | ICD-10-CM | POA: Diagnosis present

## 2016-01-14 DIAGNOSIS — F329 Major depressive disorder, single episode, unspecified: Secondary | ICD-10-CM | POA: Diagnosis present

## 2016-01-14 MED ORDER — MAGNESIUM HYDROXIDE 400 MG/5ML PO SUSP: 30.0000 mL | Freq: Every day | ORAL | Status: DC | PRN

## 2016-01-14 MED ORDER — SENNOSIDES-DOCUSATE SODIUM 8.6-50 MG PO TABS: 1.0000 | ORAL_TABLET | Freq: Every evening | ORAL | Status: DC | PRN

## 2016-01-14 MED ORDER — TRAZODONE HCL 50 MG PO TABS
50.0000 mg | ORAL_TABLET | Freq: Every evening | ORAL | Status: DC | PRN
  Administered 2016-01-15 – 2016-01-17 (×3): 50 mg via ORAL
  Filled 2016-01-14 (×12): qty 1

## 2016-01-14 MED ORDER — ALUM & MAG HYDROXIDE-SIMETH 200-200-20 MG/5ML PO SUSP: 30.0000 mL | ORAL | Status: DC | PRN

## 2016-01-14 MED ORDER — FOLIC ACID 1 MG PO TABS
1.0000 mg | ORAL_TABLET | Freq: Every day | ORAL | Status: DC
  Administered 2016-01-15 – 2016-01-20 (×6): 1 mg via ORAL
  Filled 2016-01-14 (×8): qty 1

## 2016-01-14 MED ORDER — NICOTINE POLACRILEX 2 MG MT GUM
2.0000 mg | CHEWING_GUM | OROMUCOSAL | Status: DC | PRN
Start: 2016-01-14 — End: 2016-01-21
  Administered 2016-01-17: 2 mg via ORAL
  Filled 2016-01-14: qty 1

## 2016-01-14 MED ORDER — ADULT MULTIVITAMIN W/MINERALS CH
1.0000 | ORAL_TABLET | Freq: Every day | ORAL | Status: DC
  Administered 2016-01-15 – 2016-01-20 (×6): 1 via ORAL
  Filled 2016-01-14 (×8): qty 1

## 2016-01-14 MED ORDER — HYDROXYZINE HCL 50 MG PO TABS
50.0000 mg | ORAL_TABLET | Freq: Every evening | ORAL | Status: DC | PRN
  Administered 2016-01-14 – 2016-01-17 (×4): 50 mg via ORAL
  Filled 2016-01-14 (×4): qty 1

## 2016-01-14 MED ORDER — NITROFURANTOIN MONOHYD MACRO 100 MG PO CAPS
100.0000 mg | ORAL_CAPSULE | Freq: Two times a day (BID) | ORAL | Status: DC
  Administered 2016-01-14 – 2016-01-20 (×12): 100 mg via ORAL
  Filled 2016-01-14 (×6): qty 1
  Filled 2016-01-14: qty 5
  Filled 2016-01-14 (×2): qty 1
  Filled 2016-01-14: qty 5
  Filled 2016-01-14 (×6): qty 1
  Filled 2016-01-14: qty 5
  Filled 2016-01-14: qty 1

## 2016-01-14 MED ORDER — SENNOSIDES-DOCUSATE SODIUM 8.6-50 MG PO TABS
1.0000 | ORAL_TABLET | Freq: Once | ORAL | Status: AC
  Administered 2016-01-14: 1 via ORAL
  Filled 2016-01-14: qty 1

## 2016-01-14 MED ORDER — ENSURE ENLIVE PO LIQD
237.0000 mL | Freq: Two times a day (BID) | ORAL | Status: DC
  Administered 2016-01-15 – 2016-01-19 (×9): 237 mL via ORAL

## 2016-01-14 MED ORDER — ACETAMINOPHEN 325 MG PO TABS
650.0000 mg | ORAL_TABLET | Freq: Four times a day (QID) | ORAL | Status: DC | PRN
  Administered 2016-01-17: 650 mg via ORAL
  Filled 2016-01-14: qty 2

## 2016-01-14 NOTE — Clinical Social Work Psych Note (Addendum)
Psych CS[facility] by Dr Dub MikesLugo.   [facility] to [facility] at 7pm.  [facility] will transport and RN is agreeable to call report to [phone].  Voluntary form signed and faxed to [phone].  Vickii PennaGina Virginio Isidore, LCSW [phone]  5N1-9; 2S 15-16 and Hospital Psychiatric Service Line Licensed Clinical Social Worker

## 2016-01-14 NOTE — Progress Notes (Signed)
Chaplain presented to the patient's room for spiritual care support. The patient was sleeping at the time of this visit, a sitter is present. Will follow up at a later time, Chaplain Janell Quietudrey Lateria Alderman [phone]

## 2016-01-14 NOTE — Care Management Note (Signed)
Case Management Note  Patient Details  Name: Courtney Ponce MRN: [id number] Date of Birth: 01/07/1976  Subjective/Objective:     Overdose Suicide Attempt               Action/Plan: Discharge Planning:  Chart reviewed. [facility] following [facility]    Expected Discharge Date:  01/14/2016            Expected Discharge Plan:  Psychiatric Hospital  In-House Referral:  Clinical Social Work  Discharge planning Services  CM Consult  Post Acute Care Choice:  NA Choice offered to:  NA  DME Arranged:  N/A DME Agency:  NA  HH Arranged:  NA HH Agency:  NA  Status of Service:  Completed, signed off  Medicare Important Message Given:    Date Medicare IM Given:    Medicare IM give by:    Date Additional Medicare IM Given:    Additional Medicare Important Message give by:     If discussed at Long Length of Stay Meetings, dates discussed:    Additional Comments:  [name], [name] Ellen, RN 01/14/2016, 3:46 PM

## 2016-01-14 NOTE — Clinical Social Work Psych Note (Signed)
Psych CSW followed up on the referrals sent on 01/13/2016.  [facility]- [facility]- [facility]- no appropriate [facility] availability [facility]- referral re-sent [facility]- referral made  Vickii PennaGina Firman Petrow, LCSW [phone]  5N1-9; 2S 15-16 and Hospital Psychiatric Service Line Licensed Clinical Social Worker

## 2016-01-14 NOTE — Consult Note (Signed)
[facility] Face-to-Face Psychiatry Consult follow-up  Reason for Consult:  Suicide attempt by overdose, Cocaine dependence Referring Physician:  Dr. Criselda Peaches Patient Identification: Courtney Ponce MRN:  [id number] Principal Diagnosis: Suicide attempt by drug ingestion [facility]) Diagnosis:   Patient Active Problem List   Diagnosis Date Noted  . Cocaine use disorder, severe, dependence (HCC) [F14.20] 01/12/2016  . Cocaine abuse with cocaine-induced mood disorder (HCC) [F14.14] 01/12/2016  . Suicide attempt by drug ingestion (HCC) [T50.902A] 01/12/2016  . QT prolongation [I45.81] 01/11/2016  . SSRI overdose [T43.221A] 01/11/2016  . Overdose [T50.901A]   . Wheezing [R06.2]   . UTI (lower urinary tract infection) [N39.0] 12/13/2015  . Cocaine abuse [F14.10] 12/13/2015  . Muscle weakness (generalized) [M62.81] 12/07/2013  . Lack of coordination [R27.9] 12/07/2013  . Wrist fracture, right [S62.101A] 11/24/2013  . Right wrist fracture [S62.101A] 10/17/2013  . Distal radius fracture [S52.509A] 10/10/2013    Total Time spent with patient: 30 minutes  Subjective:   Courtney Ponce is a 40 y.o. female patient admitted after she attempted suicide by overdosing on Cymbalta and Celexa.  HPI: Thanks for asking me to do a psychiatric consultation on [name]. She reports history of drug addiction, depression and Anxiety. Patient reports she has been stressed out and overwhelmed with life lately. She presents to the hospital after she overdosed on 60 pills of Celexa and 120 pills of Cymbalta, old medications that belongs to her deceased mother. Patient reports long history of Cocaine addiction, using since age 81 but was cleaned for 3 years and relapsed about 6 months ago. She quit her job at [employer] and moved back home in [place], [place] to live with family.  Patient reports that she has been going on crack cocaine binge with increased depression and mood swings. States that she felt like her life is quickly  relapsing into shamble and as a result made a resolution to kill herself 2 nights ago by OD. Patient denies prior history psychosis or delusional thinking but endorsed one suicide attempt in the past, taking "a bunch of pills" when she was a teenager. Currently, she verbalizes neurovegetative symptoms of depression: anhedonia, hopelessness, psychomotor agitation, sleep disturbance, and impaired concentration. She has been engaging in self harming(cutting) behavior to relieve emotional pain. She reports getting "high" on cocaine and  uses cannabis and xanax "to come down."  She also smokes 1.5 ppd and occasionally binge drinks alcohol. Patient is requesting for drug detoxification, she is unable to commit for safety. She has history of drug rehab at [facility]. Past Psychiatric History: Polysubstance abuse. Depression  Interval history: Patient seen face-to-face for psychiatric Psychiatric consultation follow-up. Patient appeared awake, alert, oriented to time place person and situation. Patient endorses having multiple family problems, substance abuse and also intentional overdose of medication to end her life. Patient has no current job and states relationship with her boyfriend is not good impact on. Patient is willing to participate in acute psychiatric hospitalization for crisis stabilization and also willing to participate in substance abuse rehabilitation when medically and psychiatrically stable.  Risk to Self: Is patient at risk for suicide?: yes Risk to Others:   Prior Inpatient Therapy:   Prior Outpatient Therapy:    Past Medical History:  Past Medical History  Diagnosis Date  . Anxiety   . Depression   . Abnormal Pap smear   . GERD (gastroesophageal reflux disease)   . Neuromuscular disorder (HCC)   . Chronic back pain   . Kidney infection  Past Surgical History  Procedure Laterality Date  . Wisdom tooth extraction    . Cystoscopy with urethral dilatation      age 89  . Colposcopy   2002   Family History:  Family History  Problem Relation Age of Onset  . Cancer Mother     breast w/metastasis to liver,lung,bone, brain  . Hypertension Sister   . Thyroid disease Sister   . Cancer Maternal Aunt     ovarian w/metastasis to colon   Family Psychiatric  History: depression Social History:  History  Alcohol Use  . Yes    Comment: socially     History  Drug Use  . Yes  . Special: Cocaine    Comment: lost 2 weeks ago    Social History   Social History  . Marital Status: Single    Spouse Name: N/A  . Number of Children: N/A  . Years of Education: N/A   Social History Main Topics  . Smoking status: Current Every Day Smoker -- 1.00 packs/day for 22 years    Types: Cigarettes  . Smokeless tobacco: Never Used  . Alcohol Use: Yes     Comment: socially  . Drug Use: Yes    Special: Cocaine     Comment: lost 2 weeks ago  . Sexual Activity: Yes    Birth Control/ Protection: Injection   Other Topics Concern  . None   Social History Narrative   Additional Social History:    Allergies:   Allergies  Allergen Reactions  . Shellfish Allergy Anaphylaxis  . Wellbutrin [Bupropion Hcl] Hives    Labs:  No results found for this or any previous visit (from the past 48 hour(s)).  Current Facility-Administered Medications  Medication Dose Route Frequency Provider Last Rate Last Dose  . enoxaparin (LOVENOX) injection 40 mg  40 mg Subcutaneous Q24H Courtney ParisEden W Jones, MD   40 mg at 01/13/16 1730  . folic acid (FOLVITE) tablet 1 mg  1 mg Oral Daily Courtney ParisEden W Jones, MD   1 mg at 01/14/16 0942  . multivitamin with minerals tablet 1 tablet  1 tablet Oral Daily Courtney ParisEden W Jones, MD   1 tablet at 01/14/16 [phone]  . nitrofurantoin (macrocrystal-monohydrate) (MACROBID) capsule 100 mg  100 mg Oral Q12H Selina CooleyKyle Flores, MD   100 mg at 01/14/16 0942  . sodium chloride flush (NS) 0.9 % injection 3 mL  3 mL Intravenous Q12H Courtney ParisEden W Jones, MD   3 mL at 01/14/16 [id number]     Musculoskeletal: Strength & Muscle Tone: within normal limits Gait & Station: unsteady Patient leans: Front  Psychiatric Specialty Exam: Review of Systems  Constitutional: Positive for malaise/fatigue.  HENT: Negative.   Eyes: Negative.   Respiratory: Negative.   Cardiovascular: Negative.   Gastrointestinal: Positive for nausea.  Genitourinary: Negative.   Skin: Negative.   Neurological: Positive for weakness.  Endo/Heme/Allergies: Negative.   Psychiatric/Behavioral: Positive for depression, suicidal ideas and substance abuse. The patient is nervous/anxious.     Blood pressure 134/92, pulse 59, temperature 98.4 F (36.9 C), temperature source Oral, resp. rate 16, height 5\' 3"  (1.6 m), weight 60.419 kg (133 lb 3.2 oz), SpO2 100 %.Body mass index is 23.6 kg/(m^2).  General Appearance: Casual  Eye Contact::  Minimal  Speech:  Clear and Coherent  Volume:  Decreased  Mood:  Depressed  Affect:  Constricted  Thought Process:  Goal Directed  Orientation:  Full (Time, Place, and Person)  Thought Content:  Negative  Suicidal Thoughts:  Yes.  without intent/plan  Homicidal Thoughts:  No  Memory:  Immediate;   Good Recent;   Good Remote;   Good  Judgement:  Impaired  Insight:  Lacking  Psychomotor Activity:  Decreased  Concentration:  Fair  Recall:  Good  Fund of Knowledge:Good  Language: Good  Akathisia:  No  Handed:  Right  AIMS (if indicated):     Assets:  Communication Skills Desire for Improvement  ADL's:  Intact  Cognition: WNL  Sleep:   poor   Treatment Plan Summary: Patent attorney as patient cannot contract for safety at this time  Daily contact with patient to assess and evaluate symptoms and progress in treatment. Hold antidepressant due to prolong QT interval secondary to Overdose. May consider antidepressant when EKG is normal.  Disposition: Recommend psychiatric Inpatient admission when medically cleared. Supportive therapy provided about  ongoing stressors. Unit social worker to assist in placing patient at inpatient psychiatric hospital for stabilization Patient may required substance abuse rehabilitation when medically and psychiatrically stable  Nehemiah Settle., MD 01/14/2016 10:45 AM

## 2016-01-14 NOTE — Progress Notes (Signed)
Patient ID: Courtney PayorMindy S Ponce, female   DOB: 08/22/1976, 40 y.o.   MRN: [id number]   Subjective: Courtney Ponce is doing well today; she has no complaints  Objective: Vital signs in last 24 hours: Filed Vitals:   01/13/16 0501 01/13/16 1959 01/14/16 0642 01/14/16 0800  BP: 130/79 136/89 134/92   Pulse: 55 65 59   Temp: 98.5 F (36.9 C) 98.9 F (37.2 C) 98.4 F (36.9 C)   TempSrc:  Oral Oral   Resp: 17 18 16    Height:      Weight: 136 lb 6.4 oz (61.871 kg)  133 lb 3.2 oz (60.419 kg)   SpO2: 99% 100% 100% 100%   General: resting in bed comfortably, NAD Cardiac: regular rate and rhythm, no rubs, murmurs or gallops Pulm: breathing well, clear to auscultation bilaterally Abd: bowel sounds normal, soft, nondistended, non-tender Ext: warm and well perfused, without pedal edema, Neuro:  alert and oriented X3, cranial nerves II-XII grossly intact, moving all extremities well  Lab Results: Micro Results: Recent Results (from the past 240 hour(s))  Urine culture     Status: Abnormal   Collection Time: 01/11/16 11:00 AM  Result Value Ref Range Status   Specimen Description URINE, CLEAN CATCH  Final   Special Requests NONE  Final   Culture >=100,000 COLONIES/mL ESCHERICHIA COLI (A)  Final   Report Status 01/13/2016 FINAL  Final   Organism ID, Bacteria ESCHERICHIA COLI (A)  Final      Susceptibility   Escherichia coli - MIC*    AMPICILLIN <=2 SENSITIVE Sensitive     CEFAZOLIN <=4 SENSITIVE Sensitive     CEFTRIAXONE <=1 SENSITIVE Sensitive     CIPROFLOXACIN <=0.25 SENSITIVE Sensitive     GENTAMICIN <=1 SENSITIVE Sensitive     IMIPENEM <=0.25 SENSITIVE Sensitive     NITROFURANTOIN <=16 SENSITIVE Sensitive     TRIMETH/SULFA <=20 SENSITIVE Sensitive     AMPICILLIN/SULBACTAM <=2 SENSITIVE Sensitive     PIP/TAZO <=4 SENSITIVE Sensitive     * >=100,000 COLONIES/mL ESCHERICHIA COLI    Medications: I have reviewed the patient's current medications. Scheduled Meds: . enoxaparin (LOVENOX)  injection  40 mg Subcutaneous Q24H  . folic acid  1 mg Oral Daily  . multivitamin with minerals  1 tablet Oral Daily  . nitrofurantoin (macrocrystal-monohydrate)  100 mg Oral Q12H  . sodium chloride flush  3 mL Intravenous Q12H   Continuous Infusions:   PRN Meds:.   Assessment/Plan:  SSRI/SNRI Overdose Suicide Attempt: She is medically-cleared for discharge. She is not clinically in serotonin syndrome, her electrolytes are normal, and her QTc is normal. -She is medically-cleared for discharge to inpatient psychiatry today  Uncomplicated urinary tract infection: Her symptoms have resolved with ceftriaxone. No fevers nor flank-tenderness. -Will discharge with 2 more days of nitrofurantoin  Dispo: Discharge to inpatient psychiatry today. She is medically cleared.  The patient does not have a current PCP (No Pcp Per Patient) and [facility] after discharge.  The patient does not know have transportation limitations that hinder transportation to clinic appointments.  .Services Needed at time of discharge: Y = Yes, Blank = No PT:   OT:   RN:   Equipment:   Other:     LOS: 2 days   Ruben ImJeremy Addis Bennie, MD 01/14/2016, 11:38 AM

## 2016-01-14 NOTE — Progress Notes (Signed)
Courtney Ponce is a 40 year old female being admitted voluntarily to [facility] 305-1 [facility].  She was admitted medically for ingesting 60 pills of Celexa and 120 pills of Cymbalta in an attempt to kill herself.  "I was high on crack and my sister didn't stop me."  She has long history of cocaine addiction since age 40.  She has been clean for 3 years and relapsed about 6 months ago.  She has been having increased depression and mood swings.  She has history of once suicide attempt when she was a teenager.  She states that she has anhedonia, hopelessness, problems sleeping and poor concentration.  She has recently been cutting to relieve emotional pain.  She denies HI or A/V hallucinations.  Admission paperwork completed and signed.  Courtney Ponce came in with no belongings.  Skin assessment completed and noted multiple self inflicted cut/scars on upper and lower left arm and Right/Left upper and lower legs, multiple tattoos and abrasion beside her right eye.  Q 15 minute checks initiated for safety.  We will monitor the progress towards her goals.

## 2016-01-14 NOTE — Tx Team (Signed)
Initial Interdisciplinary Treatment Plan   PATIENT STRESSORS: Financial difficulties Marital or family conflict Occupational concerns Substance abuse   PATIENT STRENGTHS: Capable of independent living Communication skills   PROBLEM LIST: Problem List/Patient Goals Date to be addressed Date deferred Reason deferred Estimated date of resolution  Depression 01/14/16     Anxiety  01/14/16     Suicide attempt 01/14/16     Substance abuse 01/14/16     "Find an antidepressant that can keep me stable" 01/14/16     "Stay away from crack" 01/14/16                        DISCHARGE CRITERIA:  Improved stabilization in mood, thinking, and/or behavior Motivation to continue treatment in a less acute level of care Verbal commitment to aftercare and medication compliance  PRELIMINARY DISCHARGE PLAN: Outpatient therapy Medication management  PATIENT/FAMIILY INVOLVEMENT: This treatment plan has been presented to and reviewed with the patient, [name].  The patient and family have been given the opportunity to ask questions and make suggestions.  Norm ParcelHeather V Juliette Standre 01/14/2016, 9:35 PM

## 2016-01-15 ENCOUNTER — Encounter (HOSPITAL_COMMUNITY): Payer: Self-pay | Admitting: Psychiatry

## 2016-01-15 DIAGNOSIS — T1491 Suicide attempt: Secondary | ICD-10-CM

## 2016-01-15 DIAGNOSIS — T43222A Poisoning by selective serotonin reuptake inhibitors, intentional self-harm, initial encounter: Secondary | ICD-10-CM

## 2016-01-15 DIAGNOSIS — T43212A Poisoning by selective serotonin and norepinephrine reuptake inhibitors, intentional self-harm, initial encounter: Secondary | ICD-10-CM

## 2016-01-15 DIAGNOSIS — F322 Major depressive disorder, single episode, severe without psychotic features: Secondary | ICD-10-CM | POA: Diagnosis present

## 2016-01-15 MED ORDER — GABAPENTIN 300 MG PO CAPS
300.0000 mg | ORAL_CAPSULE | Freq: Three times a day (TID) | ORAL | Status: DC
  Administered 2016-01-15 – 2016-01-20 (×16): 300 mg via ORAL
  Filled 2016-01-15: qty 28
  Filled 2016-01-15 (×6): qty 1
  Filled 2016-01-15: qty 28
  Filled 2016-01-15 (×11): qty 1
  Filled 2016-01-15: qty 28
  Filled 2016-01-15 (×2): qty 1
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
## 2016-01-15 NOTE — Progress Notes (Signed)
Recreation Therapy Notes  Animal-Assisted Activity (AAA) Program Checklist/Progress Notes Patient Eligibility Criteria Checklist & Daily Group note for Rec Tx Intervention  Date: 04.12.2017 Time: 2:45pm Location: [address]   AAA/T Program Assumption of Risk Form signed by Patient/ or Parent Legal Guardian Yes  Patient is free of allergies or sever asthma Yes  Patient reports no fear of animals Yes  Patient reports no history of cruelty to animals Yes  Patient understands his/her participation is voluntary Yes  Behavioral Response: Did not attend.    Marykay Lexenise L Dorwin Fitzhenry, LRT/CTRS         Ram Haugan L 01/15/2016 3:03 PM

## 2016-01-15 NOTE — BHH Group Notes (Signed)
BHH LCSW Group Therapy 01/15/2016  1:15 PM   Type of Therapy: Group Therapy  Participation Level: Did Not Attend. Patient invited to participate but declined.   Christinia Lambeth, MSW, LCSW Clinical Social Worker [facility] [phone]   

## 2016-01-15 NOTE — Progress Notes (Signed)
D: Pt presents with flat affect and depressed mood. Pt have minimal interaction and cautious during shift assessment. Pt forwarded little information and appeared guarded. Pt rates depression 2/10. Anxiety 3/10. Pt reports poor sleep at hs. Writer looked up pt scheduled sleep med on MAR. Writer verbalized to pt that she declined trazodone at bedtime and took vistaril. Writer encouraged pt to take trazodone tonight at bedtime if she's having difficulty sleeping. Pt verbalized understanding. A: Medications administered as ordered per MD. Verbal support provided. Pt encouraged to attend groups. 15 minute checks performed for safety.  R: Pt stated goal "inner peace and healing". Pt receptive to tx.
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
## 2016-01-15 NOTE — [facility] Suicide Risk Assessment (Addendum)
[facility] INPATIENT:  Family/Significant Other Suicide Prevention Education  Suicide Prevention Education:  Patient Refusal for Family/Significant Other Suicide Prevention Education: The patient Courtney Ponce has refused to provide written consent for family/significant other to be provided Family/Significant Other Suicide Prevention Education during admission and/or prior to discharge.  Physician notified.  Patient rescinded permission to talk w Kaylyn LimJoan Robison whom she had previously identified as community support.  States she does not want any contact w collaterals.   Sallee LangeCunningham, Anne C 01/15/2016, 5:17 PM

## 2016-01-16 DIAGNOSIS — F322 Major depressive disorder, single episode, severe without psychotic features: Principal | ICD-10-CM

## 2016-01-16 NOTE — Plan of Care (Signed)
Problem: Diagnosis: Increased Risk For Suicide Attempt Goal: STG-Patient Will Comply With Medication Regime Outcome: Progressing Pt has been compliant with medications tonight.       

## 2016-01-16 NOTE — Progress Notes (Signed)
D: Pt has depressed affect and mood.  Pt was in bed in her room upon initial approach.  Pt made goal with writer to "have a good night and get some rest."  Pt reports her day was "okay."  Pt denies SI/HI, denies hallucinations, denies pain.  Pt was more visible in milieu after group was over.  Pt did not attend evening group.   A: Introduced self to pt.  Actively listened to pt and offered support and encouragement.  Medications administered per order.  PRN medication administered for sleep. R: Pt is compliant with medications.  Pt verbally contracts for safety.  Will continue to monitor and assess.

## 2016-01-16 NOTE — Progress Notes (Signed)
Pt reports she is doing good tonight and feeling more hopeful.  She had a visitor tonight, and says this lady was her mother's best friend.  The lady is going to allow her to stay with her until she can "get on her feet".  She denies SI/HI/AVH.  She feels her meds are helping her mood.   Pt makes her needs known to staff.  She has been pleasant and cooperative with staff.  Support and encouragement offered.  Discharge plans are in process.  Safety maintained with q15 minute checks.

## 2016-01-16 NOTE — Progress Notes (Signed)
Recreation Therapy Notes  Date: 04.12.2017 Time: 9:30am Location: [address] Group Room   Group Topic: Stress Management  Goal Area(s) Addresses:  Patient will actively participate in stress management techniques presented during session.   Behavioral Response: Did not attend.    Courtney Ponce, LRT/CTRS        Courtney Ponce 01/16/2016 6:54 PM 

## 2016-01-16 NOTE — Progress Notes (Addendum)
[facility] MD Progress Note  01/16/2016 1:40 PM Courtney Ponce  MRN:  [id number] Subjective:  Patient reports that today she is feeling "better". States that her overdose was impulsive, not planned out , and happened while intoxicated with cocaine. States it occurred in the context of argument with her sister . States " I do not want to die, it was a spur of the moment thing ". At this time feeling better, less depressed. Denies medication side effects. Objective : I have reviewed case with treatment team and have met with patient . Patient is a 40 year old female, history of depression, cocaine abuse, and recent suicidal attempt by overdosing . As above, reports feeling better, and does not endorse severe depression at this time . Denies any current suicidal ideations . States sleep and appetite improving . Denies medication side effects. No disruptive or agitated behaviors on unit .  Principal Problem: Severe major depression without psychotic features ([place]) Diagnosis:   Patient Active Problem List   Diagnosis Date Noted  . Severe major depression without psychotic features ([place]) [F32.2] 01/15/2016  . Suicide attempt ([place]) [T14.91] 01/14/2016  . Cocaine use disorder, severe, dependence ([place]) [F14.20] 01/12/2016  . Cocaine abuse with cocaine-induced mood disorder ([place]) [F14.14] 01/12/2016  . Suicide attempt by drug ingestion ([place]) [T50.902A] 01/12/2016  . QT prolongation [I45.81] 01/11/2016  . SSRI overdose [T43.221A] 01/11/2016  . Overdose [T50.901A]   . Wheezing [R06.2]   . UTI (lower urinary tract infection) [N39.0] 12/13/2015  . Cocaine abuse [F14.10] 12/13/2015  . Muscle weakness (generalized) [M62.81] 12/07/2013  . Lack of coordination [R27.9] 12/07/2013  . Wrist fracture, right [S62.101A] 11/24/2013  . Right wrist fracture [S62.101A] 10/17/2013  . Distal radius fracture [S52.509A] 10/10/2013   Total Time spent with patient: 20 minutes    Past Medical History:  Past Medical  History  Diagnosis Date  . Anxiety   . Depression   . Abnormal Pap smear   . GERD (gastroesophageal reflux disease)   . Neuromuscular disorder ([place])   . Chronic back pain   . Kidney infection     Past Surgical History  Procedure Laterality Date  . Wisdom tooth extraction    . Cystoscopy with urethral dilatation      age 60  . Colposcopy  2002   Family History:  Family History  Problem Relation Age of Onset  . Cancer Mother     breast w/metastasis to liver,lung,bone, brain  . Hypertension Sister   . Thyroid disease Sister   . Cancer Maternal Aunt     ovarian w/metastasis to colon    Social History:  History  Alcohol Use  . Yes    Comment: socially     History  Drug Use  . Yes  . Special: Cocaine    Comment: lost 2 weeks ago    Social History   Social History  . Marital Status: Single    Spouse Name: N/A  . Number of Children: N/A  . Years of Education: N/A   Social History Main Topics  . Smoking status: Current Every Day Smoker -- 1.00 packs/day for 22 years    Types: Cigarettes  . Smokeless tobacco: Never Used  . Alcohol Use: Yes     Comment: socially  . Drug Use: Yes    Special: Cocaine     Comment: lost 2 weeks ago  . Sexual Activity: Yes    Birth Control/ Protection: Injection   Other Topics Concern  . None   Social  History Narrative   Additional Social History:   Sleep: improved   Appetite:  improved   Current Medications: Current Facility-Administered Medications  Medication Dose Route Frequency Provider Last Rate Last Dose  . acetaminophen (TYLENOL) tablet 650 mg  650 mg Oral Q6H PRN Niel Hummer, NP      . alum & mag hydroxide-simeth (MAALOX/MYLANTA) 200-200-20 MG/5ML suspension 30 mL  30 mL Oral Q4H PRN Niel Hummer, NP      . feeding supplement (ENSURE ENLIVE) (ENSURE ENLIVE) liquid 237 mL  237 mL Oral BID BM Nicholaus Bloom, MD   237 mL at 01/16/16 0818  . folic acid (FOLVITE) tablet 1 mg  1 mg Oral Daily Niel Hummer, NP   1 mg  at 01/16/16 0817  . gabapentin (NEURONTIN) capsule 300 mg  300 mg Oral TID Nicholaus Bloom, MD   300 mg at 01/16/16 1209  . hydrOXYzine (ATARAX/VISTARIL) tablet 50 mg  50 mg Oral QHS PRN Niel Hummer, NP   50 mg at 01/15/16 2131  . magnesium hydroxide (MILK OF MAGNESIA) suspension 30 mL  30 mL Oral Daily PRN Niel Hummer, NP      . multivitamin with minerals tablet 1 tablet  1 tablet Oral Daily Niel Hummer, NP   1 tablet at 01/16/16 3299  . nicotine polacrilex (NICORETTE) gum 2 mg  2 mg Oral PRN Nicholaus Bloom, MD      . nitrofurantoin (macrocrystal-monohydrate) (MACROBID) capsule 100 mg  100 mg Oral Q12H Niel Hummer, NP   100 mg at 01/16/16 0817  . traZODone (DESYREL) tablet 50 mg  50 mg Oral QHS,MR X 1 Laverle Hobby, PA-C   50 mg at 01/15/16 2130    Lab Results: No results found for this or any previous visit (from the past 48 hour(s)).  Blood Alcohol level:  Lab Results  Component Value Date   [facility] <5 01/11/2016   ETH <11 11/29/2011    Physical Findings: AIMS: Facial and Oral Movements Muscles of Facial Expression: None, normal Lips and Perioral Area: None, normal Jaw: None, normal Tongue: None, normal,Extremity Movements Upper (arms, wrists, hands, fingers): None, normal Lower (legs, knees, ankles, toes): None, normal, Trunk Movements Neck, shoulders, hips: None, normal, Overall Severity Severity of abnormal movements (highest score from questions above): None, normal Incapacitation due to abnormal movements: None, normal Patient's awareness of abnormal movements (rate only patient's report): No Awareness, Dental Status Current problems with teeth and/or dentures?: No Does patient usually wear dentures?: No  CIWA:  CIWA-Ar Total: 1 COWS:     Musculoskeletal: Strength & Muscle Tone: within normal limits Gait & Station: normal Patient leans: N/A  Psychiatric Specialty Exam: ROS no chest pain, no shortness of breath, no vomiting   Blood pressure 134/86, pulse 109,  temperature 98.5 F (36.9 C), temperature source Oral, resp. rate 18, height _0  (1.6 m), weight 136 lb (61.689 kg), SpO2 100 %.Body mass index is 24.1 kg/(m^2).  General Appearance: Fairly Groomed  Engineer, water::  Good  Speech:  Normal Rate  Volume:  Decreased  Mood:  reports feeling better, less depressed   Affect:  Appropriate and mildly constricted   Thought Process:  Linear  Orientation:  Full (Time, Place, and Person)  Thought Content:  denies hallucinations, no delusions, not internally preoccupied   Suicidal Thoughts:  No- at this time denies any suicidal ideations, and denies any self injurious ideations   Homicidal Thoughts:  No  Memory:  recent  and remote grossly intact   Judgement:  Fair  Insight:  Fair  Psychomotor Activity:  Normal  Concentration:  Good  Recall:  Good  Fund of Knowledge:Good  Language: Good  Akathisia:  Negative  Handed:  Right  AIMS (if indicated):     Assets:  Desire for Improvement Resilience  ADL's:  Intact  Cognition: WNL  Sleep:  Number of Hours: 6.25  Assessment - patient reports recent suicidal attempt was impulsive, not planned out and occurred while intoxicated. Denies any current suicidal ideations and contracts for safety . Reports improving mood. Denies medication side effects at this time Treatment Plan Summary: Daily contact with patient to assess and evaluate symptoms and progress in treatment, Medication management, Plan inpatient treatment  and medications as below  Encourage group and milieu participation to work on coping skills and symptom reduction Continue to encourage abstinence and relapse prevention efforts  Continue Neurontin 300 mgrs TID for pain and anxiety Continue Trazodone 50 mgrs QHS for insomnia as needed  Repeat EKG to follow up on QTc interval Treatment team working on disposition planning options  Neita Garnet, MD 01/16/2016, 1:40 PM

## 2016-01-16 NOTE — Progress Notes (Signed)
NUTRITION ASSESSMENT  Pt identified as at risk on the Malnutrition Screen Tool  INTERVENTION: 1. Supplements: Ensure Enlive po BID, each supplement provides 350 kcal and 20 grams of protein  NUTRITION DIAGNOSIS: Unintentional weight loss related to sub-optimal intake as evidenced by pt report.   Goal: Pt to meet >/= 90% of their estimated nutrition needs.  Monitor:  PO intake  Assessment:  Pt admitted with depression, suicide attempt from overdose and substance abuse(cocaine).  Pt with very significant weight loss since June 2016 (27% wt loss x 10 months). Pt has been ordered Ensure supplements, will continue.  Height: Ht Readings from Last 1 Encounters:  01/14/16 5\' 3"  (1.6 m)    Weight: Wt Readings from Last 1 Encounters:  01/14/16 136 lb (61.689 kg)    Weight Hx: Wt Readings from Last 10 Encounters:  01/14/16 136 lb (61.689 kg)  01/14/16 133 lb 3.2 oz (60.419 kg)  12/16/15 146 lb (66.225 kg)  12/13/15 146 lb 6.2 oz (66.4 kg)  03/25/15 187 lb 8 oz (85.049 kg)  03/04/15 185 lb (83.915 kg)  01/05/14 170 lb (77.111 kg)  10/17/13 170 lb (77.111 kg)  10/16/13 160 lb (72.576 kg)  10/10/13 170 lb (77.111 kg)    BMI:  Body mass index is 24.1 kg/(m^2). Pt meets criteria for normal range based on current BMI.  Estimated Nutritional Needs: Kcal: 25-30 kcal/kg Protein: > 1 gram protein/kg Fluid: 1 ml/kcal  Diet Order: Diet regular Room service appropriate?: Yes; Fluid consistency:: Thin Pt is also offered choice of unit snacks mid-morning and mid-afternoon.  Pt is eating as desired.   Lab results and medications reviewed.   Tilda FrancoLindsey Damont Balles, MS, RD, LDN Pager: [phone] After Hours Pager: [phone]

## 2016-01-16 NOTE — [facility] Group Notes (Signed)
[facility] LCSW Aftercare Discharge Planning Group Note  01/16/2016  8:45 AM  Participation Quality: Did Not Attend. Patient invited to participate but declined.  Yandell Mcjunkins, MSW, LCSW Clinical Social Worker [facility] [phone]   

## 2016-01-16 NOTE — [facility] Group Notes (Signed)
[facility] LCSW Group Therapy 01/16/2016  1:15 PM Type of Therapy: Group Therapy Participation Level: Minimal  Participation Quality: Attentive  Affect: Appropriate  Cognitive: Alert and Oriented  Insight: Developing/Improving and Engaged  Engagement in Therapy: Developing/Improving and Engaged  Modes of Intervention: Clarification, Confrontation, Discussion, Education, Exploration, Limit-setting, Orientation, Problem-solving, Rapport Building, Dance movement psychotherapisteality Testing, Socialization and Support  Summary of Progress/Problems: The topic for group today was emotional regulation. This group focused on both positive and negative emotion identification and allowed group members to process ways to identify feelings, regulate negative emotions, and find healthy ways to manage internal/external emotions. Group members were asked to reflect on a time when their reaction to an emotion led to a negative outcome and explored how alternative responses using emotion regulation would have benefited them. Group members were also asked to discuss a time when emotion regulation was utilized when a negative emotion was experienced. Patient did not engage in discussion despite CSW encouragement.  Courtney BruinKristin Addisynn Ponce, MSW, LCSW Clinical Social Worker [facility] [phone]

## 2016-01-16 NOTE — Clinical Social Work Note (Signed)
Kathie RhodesKevin Costigliola, [facility] RN, wants to visit patient to discuss aftercare planning, ROI signed and w CSW [place].  Patient aware and agreeable.  Santa GeneraAnne Brynn Mulgrew, LCSW Lead Clinical Social Worker Phone:  [phone]

## 2016-01-16 NOTE — Progress Notes (Signed)
D: Pt presents with flat affect. Pt reports no depression at this time. Pt reports decreasing anxiety 2/10. Pt denies suicidal thoughts. Pt reports that she was able to sleep well last after taking the trazodone. Pt reports that she's tolerating her meds well. No adverse reaction to meds verbalized by pt. Pt reports that she would like to be more involved in her tx and attend groups today. A: Medications administered as ordered per MD. Verbal support provided. Pt encouraged to attend groups. 15 minute checks performed for safety.  R: Pt receptive to tx.

## 2016-01-16 NOTE — [facility] Group Notes (Signed)
Patient attend group. 

## 2016-01-16 NOTE — [facility] Group Notes (Signed)
Patient did not attend fropu.

## 2016-01-17 MED ORDER — DOCUSATE SODIUM 100 MG PO CAPS
100.0000 mg | ORAL_CAPSULE | Freq: Every day | ORAL | Status: DC
  Administered 2016-01-17 – 2016-01-20 (×4): 100 mg via ORAL
  Filled 2016-01-17 (×6): qty 1

## 2016-01-17 NOTE — Progress Notes (Signed)
Patient ID: Courtney Ponce, female   DOB: 10/18/1975, 40 y.o.   MRN: [id number]  DAR: Pt. Denies SI/HI and A/V Hallucinations. She reports sleep is fair, appetite is good, energy level is low, and concentration is good. She rates depression and anxiety 2/10, and hopelessness 0/10. Patient does not report any pain or discomfort at this time. She denies withdrawal symptoms as well. Support and encouragement provided to the patient. Scheduled medications administered to patient per physician's orders. EKG performed and MD AfghanistanLugo notified. Patient is receptive and cooperative. She is seen in the milieu interacting with peers. Q15 minute checks are maintained for safety.

## 2016-01-17 NOTE — [facility] Group Notes (Signed)
[facility] LCSW Group Therapy 01/17/2016 1:15 PM Type of Therapy: Group Therapy Participation Level: Active  Participation Quality: Attentive, Sharing and Supportive  Affect: Appropriate  Cognitive: Alert and Oriented  Insight: Developing/Improving and Engaged  Engagement in Therapy: Developing/Improving and Engaged  Modes of Intervention: Activity, Clarification, Confrontation, Discussion, Education, Exploration, Limit-setting, Orientation, Problem-solving, Rapport Building, Dance movement psychotherapisteality Testing, Socialization and Support  Summary of Progress/Problems: Patient was attentive and engaged with speaker from [employer]. Patient was attentive to speaker while they shared their story of dealing with mental health and overcoming it. Patient expressed interest in their programs and services and received information on their agency. Patient processed ways they can relate to the speaker.   Courtney BruinKristin Graciano Batson, LCSW Clinical Social Worker [facility] [phone]

## 2016-01-17 NOTE — Progress Notes (Signed)
[facility] MD Progress Note  01/17/2016 11:34 AM Courtney Ponce  MRN:  [id number] Subjective:  Courtney Ponce states she is starting to feel better. She feels that the Neurontin is helping. She states she is committed to make this happen for her. She feels that she will be ready to go after being detox Principal Problem: Severe major depression without psychotic features [facility]) Diagnosis:   Patient Active Problem List   Diagnosis Date Noted  . Severe major depression without psychotic features (HCC) [F32.2] 01/15/2016  . Suicide attempt (HCC) [T14.91] 01/14/2016  . Cocaine use disorder, severe, dependence (HCC) [F14.20] 01/12/2016  . Cocaine abuse with cocaine-induced mood disorder (HCC) [F14.14] 01/12/2016  . Suicide attempt by drug ingestion (HCC) [T50.902A] 01/12/2016  . QT prolongation [I45.81] 01/11/2016  . SSRI overdose [T43.221A] 01/11/2016  . Overdose [T50.901A]   . Wheezing [R06.2]   . UTI (lower urinary tract infection) [N39.0] 12/13/2015  . Cocaine abuse [F14.10] 12/13/2015  . Muscle weakness (generalized) [M62.81] 12/07/2013  . Lack of coordination [R27.9] 12/07/2013  . Wrist fracture, right [S62.101A] 11/24/2013  . Right wrist fracture [S62.101A] 10/17/2013  . Distal radius fracture [S52.509A] 10/10/2013   Total Time spent with patient: 20 minutes  Past Psychiatric History: see admission H and P Past Medical History:  Past Medical History  Diagnosis Date  . Anxiety   . Depression   . Abnormal Pap smear   . GERD (gastroesophageal reflux disease)   . Neuromuscular disorder (HCC)   . Chronic back pain   . Kidney infection     Past Surgical History  Procedure Laterality Date  . Wisdom tooth extraction    . Cystoscopy with urethral dilatation      age 8  . Colposcopy  2002   Family History:  Family History  Problem Relation Age of Onset  . Cancer Mother     breast w/metastasis to liver,lung,bone, brain  . Hypertension Sister   . Thyroid disease Sister   . Cancer Maternal  Aunt     ovarian w/metastasis to colon   Family Psychiatric  History: see admission H and P Social History:  History  Alcohol Use  . Yes    Comment: socially     History  Drug Use  . Yes  . Special: Cocaine    Comment: lost 2 weeks ago    Social History   Social History  . Marital Status: Single    Spouse Name: N/A  . Number of Children: N/A  . Years of Education: N/A   Social History Main Topics  . Smoking status: Current Every Day Smoker -- 1.00 packs/day for 22 years    Types: Cigarettes  . Smokeless tobacco: Never Used  . Alcohol Use: Yes     Comment: socially  . Drug Use: Yes    Special: Cocaine     Comment: lost 2 weeks ago  . Sexual Activity: Yes    Birth Control/ Protection: Injection   Other Topics Concern  . None   Social History Narrative   Additional Social History:                         Sleep: Fair  Appetite:  Fair  Current Medications: Current Facility-Administered Medications  Medication Dose Route Frequency Provider Last Rate Last Dose  . acetaminophen (TYLENOL) tablet 650 mg  650 mg Oral Q6H PRN Thermon Leyland, NP      . alum & mag hydroxide-simeth (MAALOX/MYLANTA) 200-200-20 MG/5ML suspension 30 mL  30 mL Oral Q4H PRN Thermon LeylandLaura A Davis, NP      . feeding supplement (ENSURE ENLIVE) (ENSURE ENLIVE) liquid 237 mL  237 mL Oral BID BM Rachael FeeIrving A Marlies Ligman, MD   237 mL at 01/17/16 0853  . folic acid (FOLVITE) tablet 1 mg  1 mg Oral Daily Thermon LeylandLaura A Davis, NP   1 mg at 01/17/16 [id number]  . gabapentin (NEURONTIN) capsule 300 mg  300 mg Oral TID Rachael FeeIrving A Owain Eckerman, MD   300 mg at 01/17/16 0854  . hydrOXYzine (ATARAX/VISTARIL) tablet 50 mg  50 mg Oral QHS PRN Thermon LeylandLaura A Davis, NP   50 mg at 01/16/16 2131  . magnesium hydroxide (MILK OF MAGNESIA) suspension 30 mL  30 mL Oral Daily PRN Thermon LeylandLaura A Davis, NP      . multivitamin with minerals tablet 1 tablet  1 tablet Oral Daily Thermon LeylandLaura A Davis, NP   1 tablet at 01/17/16 [phone]  . nicotine polacrilex (NICORETTE) gum 2 mg  2 mg  Oral PRN Rachael FeeIrving A Calliope Delangel, MD      . nitrofurantoin (macrocrystal-monohydrate) (MACROBID) capsule 100 mg  100 mg Oral Q12H Thermon LeylandLaura A Davis, NP   100 mg at 01/17/16 0852  . traZODone (DESYREL) tablet 50 mg  50 mg Oral QHS,MR X 1 Kerry HoughSpencer E Simon, PA-C   50 mg at 01/16/16 2109    Lab Results: No results found for this or any previous visit (from the past 48 hour(s)).  Blood Alcohol level:  Lab Results  Component Value Date   [facility] 01/11/2016   ETH <11 11/29/2011    Physical Findings: AIMS: Facial and Oral Movements Muscles of Facial Expression: None, normal Lips and Perioral Area: None, normal Jaw: None, normal Tongue: None, normal,Extremity Movements Upper (arms, wrists, hands, fingers): None, normal Lower (legs, knees, ankles, toes): None, normal, Trunk Movements Neck, shoulders, hips: None, normal, Overall Severity Severity of abnormal movements (highest score from questions above): None, normal Incapacitation due to abnormal movements: None, normal Patient's awareness of abnormal movements (rate only patient's report): No Awareness, Dental Status Current problems with teeth and/or dentures?: No Does patient usually wear dentures?: No  CIWA:  CIWA-Ar Total: 0 COWS:     Musculoskeletal: Strength & Muscle Tone: within normal limits Gait & Station: normal Patient leans: normal  Psychiatric Specialty Exam: Review of Systems  Psychiatric/Behavioral: Positive for substance abuse. The patient is nervous/anxious.     Blood pressure 118/82, pulse 74, temperature 98.4 F (36.9 C), temperature source Oral, resp. rate 18, height 5\' 3"  (1.6 m), weight 61.689 kg (136 lb), SpO2 100 %.Body mass index is 24.1 kg/(m^2).  General Appearance: Fairly Groomed  Patent attorneyye Contact::  Fair  Speech:  Clear and Coherent  Volume:  Normal  Mood:  Anxious and Depressed  Affect:  Appropriate  Thought Process:  Coherent and Goal Directed  Orientation:  Full (Time, Place, and Person)  Thought Content:   symptoms events worries concerns  Suicidal Thoughts:  No  Homicidal Thoughts:  No  Memory:  Immediate;   Fair Recent;   Fair Remote;   Fair  Judgement:  Fair  Insight:  Present and Shallow  Psychomotor Activity:  Restlessness  Concentration:  Fair  Recall:  [employer]Fair  Fund of Knowledge:Fair  Language: Fair  Akathisia:  No  Handed:  Right  AIMS (if indicated):     Assets:  Desire for Improvement Housing  ADL's:  Intact  Cognition: WNL  Sleep:  Number of Hours: 6   Treatment Plan Summary: Daily  contact with patient to assess and evaluate symptoms and progress in treatment and Medication management Supportive approach/coping skills Cocaine dependence; work a relapse prevention plan Mood instability; continue the Neurontin 300 mg QID and optimize dose-response Work with CBT/mindfulness Rachael Fee, MD 01/17/2016, 11:34 AM

## 2016-01-18 MED ORDER — GABAPENTIN 300 MG PO CAPS
300.0000 mg | ORAL_CAPSULE | Freq: Every day | ORAL | Status: DC
  Administered 2016-01-18 – 2016-01-19 (×2): 300 mg via ORAL
  Filled 2016-01-18 (×4): qty 1

## 2016-01-18 NOTE — Plan of Care (Signed)
Problem: Diagnosis: Increased Risk For Suicide Attempt Goal: LTG-Patient Will Report Improved Mood and Deny Suicidal LTG (by discharge) Patient will report improved mood and deny suicidal ideation.  Outcome: Progressing Pt denies SI.    Goal: STG-Patient Will Attend All Groups On The Unit Outcome: Not Progressing Pt did not attend a.m LCSW group.    Goal: STG-Patient Will Comply With Medication Regime Outcome: Adequate for Discharge Pt is med compliant.

## 2016-01-18 NOTE — Progress Notes (Signed)
Copelyn states today was a "happy day". She feels as if she is getting better and better each day. She is still concerned about her cardiac "issues" and hopes they will be resolved soon. She rates Depression 0/10 and Anxiety 2/10. She attended group tonight. Denies SI/HI/AVH. Encouragement and support given. Medications administered as prescribed. Continue Q 15 minute checks for patient safety and medication effectiveness.

## 2016-01-18 NOTE — Progress Notes (Signed)
Patient attended AA group and participated. 

## 2016-01-18 NOTE — Plan of Care (Signed)
Problem: Alteration in mood Goal: LTG-Patient reports reduction in suicidal thoughts (Patient reports reduction in suicidal thoughts and is able to verbalize a safety plan for whenever patient is feeling suicidal)  Outcome: Progressing Pt denies SI and verbally contracts for safety.       

## 2016-01-18 NOTE — Progress Notes (Signed)
[facility] MD Progress Note  01/18/2016 5:54 PM Courtney Ponce  MRN:  [id number] Subjective:  Courtney Ponce states she continues  to feel better. She feels that the Neurontin is helping. She states she is committed to make this happen for her. Does not want to go to a residential treatment program after detox. Worried about her QT interval and what could be going on to cause it Principal Problem: Severe major depression without psychotic features [facility]) Diagnosis:   Patient Active Problem List   Diagnosis Date Noted  . Severe major depression without psychotic features (HCC) [F32.2] 01/15/2016  . Suicide attempt (HCC) [T14.91] 01/14/2016  . Cocaine use disorder, severe, dependence (HCC) [F14.20] 01/12/2016  . Cocaine abuse with cocaine-induced mood disorder (HCC) [F14.14] 01/12/2016  . Suicide attempt by drug ingestion (HCC) [T50.902A] 01/12/2016  . QT prolongation [I45.81] 01/11/2016  . SSRI overdose [T43.221A] 01/11/2016  . Overdose [T50.901A]   . Wheezing [R06.2]   . UTI (lower urinary tract infection) [N39.0] 12/13/2015  . Cocaine abuse [F14.10] 12/13/2015  . Muscle weakness (generalized) [M62.81] 12/07/2013  . Lack of coordination [R27.9] 12/07/2013  . Wrist fracture, right [S62.101A] 11/24/2013  . Right wrist fracture [S62.101A] 10/17/2013  . Distal radius fracture [S52.509A] 10/10/2013   Total Time spent with patient: 20 minutes  Past Psychiatric History: see admission H and P Past Medical History:  Past Medical History  Diagnosis Date  . Anxiety   . Depression   . Abnormal Pap smear   . GERD (gastroesophageal reflux disease)   . Neuromuscular disorder (HCC)   . Chronic back pain   . Kidney infection     Past Surgical History  Procedure Laterality Date  . Wisdom tooth extraction    . Cystoscopy with urethral dilatation      age 73  . Colposcopy  2002   Family History:  Family History  Problem Relation Age of Onset  . Cancer Mother     breast w/metastasis to liver,lung,bone,  brain  . Hypertension Sister   . Thyroid disease Sister   . Cancer Maternal Aunt     ovarian w/metastasis to colon   Family Psychiatric  History: see admission H and P Social History:  History  Alcohol Use  . Yes    Comment: socially     History  Drug Use  . Yes  . Special: Cocaine    Comment: lost 2 weeks ago    Social History   Social History  . Marital Status: Single    Spouse Name: N/A  . Number of Children: N/A  . Years of Education: N/A   Social History Main Topics  . Smoking status: Current Every Day Smoker -- 1.00 packs/day for 22 years    Types: Cigarettes  . Smokeless tobacco: Never Used  . Alcohol Use: Yes     Comment: socially  . Drug Use: Yes    Special: Cocaine     Comment: lost 2 weeks ago  . Sexual Activity: Yes    Birth Control/ Protection: Injection   Other Topics Concern  . None   Social History Narrative   Additional Social History:                         Sleep: Fair  Appetite:  Fair  Current Medications: Current Facility-Administered Medications  Medication Dose Route Frequency Provider Last Rate Last Dose  . acetaminophen (TYLENOL) tablet 650 mg  650 mg Oral Q6H PRN Thermon Leyland, NP  650 mg at 01/17/16 1953  . alum & mag hydroxide-simeth (MAALOX/MYLANTA) 200-200-20 MG/5ML suspension 30 mL  30 mL Oral Q4H PRN Thermon Leyland, NP      . docusate sodium (COLACE) capsule 100 mg  100 mg Oral Daily Sanjuana Kava, NP   100 mg at 01/18/16 8119  . feeding supplement (ENSURE ENLIVE) (ENSURE ENLIVE) liquid 237 mL  237 mL Oral BID BM Rachael Fee, MD   237 mL at 01/18/16 0823  . folic acid (FOLVITE) tablet 1 mg  1 mg Oral Daily Thermon Leyland, NP   1 mg at 01/18/16 1478  . gabapentin (NEURONTIN) capsule 300 mg  300 mg Oral TID Rachael Fee, MD   300 mg at 01/18/16 1703  . gabapentin (NEURONTIN) capsule 300 mg  300 mg Oral QHS Rachael Fee, MD      . hydrOXYzine (ATARAX/VISTARIL) tablet 50 mg  50 mg Oral QHS PRN Thermon Leyland, NP   50  mg at 01/17/16 2113  . magnesium hydroxide (MILK OF MAGNESIA) suspension 30 mL  30 mL Oral Daily PRN Thermon Leyland, NP      . multivitamin with minerals tablet 1 tablet  1 tablet Oral Daily Thermon Leyland, NP   1 tablet at 01/18/16 2956  . nicotine polacrilex (NICORETTE) gum 2 mg  2 mg Oral PRN Rachael Fee, MD   2 mg at 01/17/16 2035  . nitrofurantoin (macrocrystal-monohydrate) (MACROBID) capsule 100 mg  100 mg Oral Q12H Thermon Leyland, NP   100 mg at 01/18/16 2130    Lab Results: No results found for this or any previous visit (from the past 48 hour(s)).  Blood Alcohol level:  Lab Results  Component Value Date   [facility] <5 01/11/2016   ETH <11 11/29/2011    Physical Findings: AIMS: Facial and Oral Movements Muscles of Facial Expression: None, normal Lips and Perioral Area: None, normal Jaw: None, normal Tongue: None, normal,Extremity Movements Upper (arms, wrists, hands, fingers): None, normal Lower (legs, knees, ankles, toes): None, normal, Trunk Movements Neck, shoulders, hips: None, normal, Overall Severity Severity of abnormal movements (highest score from questions above): None, normal Incapacitation due to abnormal movements: None, normal Patient's awareness of abnormal movements (rate only patient's report): No Awareness, Dental Status Current problems with teeth and/or dentures?: No Does patient usually wear dentures?: No  CIWA:  CIWA-Ar Total: 0 COWS:     Musculoskeletal: Strength & Muscle Tone: within normal limits Gait & Station: normal Patient leans: normal  Psychiatric Specialty Exam: Review of Systems  Psychiatric/Behavioral: Positive for substance abuse. The patient is nervous/anxious.     Blood pressure 104/68, pulse 70, temperature 98.8 F (37.1 C), temperature source Oral, resp. rate 18, height  (1.6 m), weight 61.689 kg (136 lb), SpO2 100 %.Body mass index is 24.1 kg/(m^2).  General Appearance: Fairly Groomed  Patent attorney::  Fair  Speech:  Clear and  Coherent  Volume:  Normal  Mood:  Anxious and Depressed  Affect:  Appropriate  Thought Process:  Coherent and Goal Directed  Orientation:  Full (Time, Place, and Person)  Thought Content:  symptoms events worries concerns  Suicidal Thoughts:  No  Homicidal Thoughts:  No  Memory:  Immediate;   Fair Recent;   Fair Remote;   Fair  Judgement:  Fair  Insight:  Present and Shallow  Psychomotor Activity:  Restlessness  Concentration:  Fair  Recall:  [employer] of Knowledge:Fair  Language: Fair  Akathisia:  No  Handed:  Right  AIMS (if indicated):     Assets:  Desire for Improvement Housing  ADL's:  Intact  Cognition: WNL  Sleep:  Number of Hours: 6   Treatment Plan Summary: Daily contact with patient to assess and evaluate symptoms and progress in treatment and Medication management Supportive approach/coping skills Cocaine dependence; work a relapse prevention plan Mood instability; continue the Neurontin 300 mg QID and optimize dose-response Will D/C the Trazodone and the Vistaril as a way of reducing meds that could  prolong the QT Will get a magnesium level and TSH as recommended by internal med Work with CBT/mindfulness Rachael FeeLUGO,Lakota Markgraf A, MD 01/18/2016, 5:54 PM

## 2016-01-18 NOTE — [facility] Group Notes (Signed)
Patient express day 10. She had a great day and she met her goals.

## 2016-01-18 NOTE — [facility] Group Notes (Signed)
[facility] LCSW Group Therapy 01/18/2016  1:15 PM   Type of Therapy: Group Therapy  Participation Level: Did Not Attend. Patient invited to participate but declined.   Samuella BruinKristin Jayvon Mounger, MSW, LCSW Clinical Social Worker [facility] [phone]

## 2016-01-18 NOTE — Tx Team (Signed)
Interdisciplinary Treatment Plan Update (Adult) Date: 01/18/2016    Time Reviewed: 9:30 AM  Progress in Treatment: Attending groups: Intermittently Participating in groups: Minimally Taking medication as prescribed: Yes Tolerating medication: Yes Family/Significant other contact made: No, patient has declined for collateral contact Patient understands diagnosis: Yes Discussing patient identified problems/goals with staff: Yes Medical problems stabilized or resolved: Yes Denies suicidal/homicidal ideation: Yes Issues/concerns per patient self-inventory: Yes Other:  New problem(s) identified: N/A  Discharge Plan or Barriers: Patient plans to stay with a friend at discharge to follow up with [facility].  Reason for Continuation of Hospitalization:  Depression Anxiety Medication Stabilization   Comments: N/A  Estimated length of stay: 2-3 days   Patient is a 40 year old female, admitted after suicide attempt by overdose on medications. Lived w sister and other relatiives, struggles to set limit/boundaries w others. Struggles w balancing self care w meeting needs of others, especially within the family. Has history of interpersonal trauma. Has 6 years sobriety from substance use after completing rehab program at [facility] . Relapsed in Oct 2016 after feeling overwhelmed w caregiving demands, lost job as result absences related to substance use. Has difficulty w emotion regulation, self care, requests referrals for therapy and medications management in [facility].    Review of initial/current patient goals per problem list:  1. Goal(s): Patient will participate in aftercare plan   Met: Yes   Target date: 3-5 days post admission date   As evidenced by: Patient will participate within aftercare plan AEB aftercare provider and housing plan at discharge being identified.  4/11: Goal met. Patient plans to stay with her friend at discharge to follow up with [facility]   2. Goal (s):  Patient will exhibit decreased depressive symptoms and suicidal ideations.   Met: No   Target date: 3-5 days post admission date   As evidenced by: Patient will utilize self rating of depression at 3 or below and demonstrate decreased signs of depression or be deemed stable for discharge by MD.  4/11:  Goal not met: Pt presents with flat affect and depressed mood.  Pt admitted with depression rating of 10.  Pt to show decreased sign of depression and a rating of 3 or less before d/c.    4/14: Goal progressing.    3. Goal(s): Patient will demonstrate decreased signs and symptoms of anxiety.   Met: No    Target date: 3-5 days post admission date   As evidenced by: Patient will utilize self rating of anxiety at 3 or below and demonstrated decreased signs of anxiety, or be deemed stable for discharge by MD   4/11: Goal not met: Pt presents with anxious mood and affect.  Pt admitted with anxiety rating of 10.  Pt to show decreased sign of anxiety and a rating of 3 or less before d/c.  4/14: Goal progressing.    4. Goal(s): Patient will demonstrate decreased signs of withdrawal due to substance abuse   Met: No   Target date: 3-5 days post admission date   As evidenced by: Patient will produce a CIWA/COWS score of 0, have stable vitals signs, and no symptoms of withdrawal  4/11: CIWA score of 1, experiencing anxiety.  4/14: CIWA score of 1, experiencing anxiety.    Attendees: Patient:    Family:    Physician: Dr. Sabra Heck 01/18/2016 9:30 AM  Nursing: Maureen Chatters, Kerby Nora, RN 01/18/2016 9:30 AM  Clinical Social Worker: Erasmo Downer Kirklin Mcduffee, LCSW 01/18/2016 9:30 AM  Other:  01/18/2016 9:30  AM  Other:  01/18/2016 9:30 AM  Other:  01/18/2016 9:30 AM  Other: Agustina Caroli, NP 01/18/2016 9:30 AM  Other:               Scribe for Treatment Team:  Tilden Fossa, [place]

## 2016-01-18 NOTE — [facility] Group Notes (Signed)
[facility] LCSW Aftercare Discharge Planning Group Note  01/18/2016  8:45 AM  Participation Quality: Did Not Attend. Patient invited to participate but declined.  Robbie Rideaux, MSW, LCSW Clinical Social Worker [facility] [phone]    

## 2016-01-18 NOTE — Progress Notes (Signed)
D: Pt has appropriate affect and pleasant mood.  She reports her day has "been okay."  Pt reports her goal was to "get up and be more active, try to be more positive."  Pt reports she has accomplished her goal.  Pt denies SI/HI, denies hallucinations, reports back pain of 8/10.  Pt has been much more visible in milieu tonight, interacting with peers and staff appropriately.     A: Met with pt and offered support and encouragement.  Actively listened to pt.  Medication administered per order.  PRN medication administered for sleep and pain.  Heat packs provided for pain.   R: Pt is compliant with medications.  Pt verbally contracts for safety.  She is safe on the unit.  Will continue to monitor and assess.

## 2016-01-18 NOTE — Progress Notes (Signed)
D: Courtney Ponce has been calm and cooperative on the unit today. She denied SI/HI/AVH. Pleasant upon approach. On her self inventory, she rated her depression zero, hopelessness zero, and anxiety two. She reported poor sleep, good appetite, normal energy level and good concentration. She hoped to work on her participation levels today. She did not attend a.m social worker group. A: Meds given as ordered. Q15 safety checks maintained. Support/encouragement offered. R: Pt remains free from harm and continues with treatment. Will continue to monitor for needs/safety.

## 2016-01-19 LAB — TSH: TSH: 2.375 u[IU]/mL (ref 0.350–4.500)

## 2016-01-19 LAB — COMPREHENSIVE METABOLIC PANEL
ALBUMIN: 3.5 g/dL (ref 3.5–5.0)
ALT: 46 U/L (ref 14–54)
AST: 41 U/L (ref 15–41)
Alkaline Phosphatase: 82 U/L (ref 38–126)
Anion gap: 8 (ref 5–15)
BILIRUBIN TOTAL: 0.3 mg/dL (ref 0.3–1.2)
BUN: 17 mg/dL (ref 6–20)
CO2: 28 mmol/L (ref 22–32)
CREATININE: 0.67 mg/dL (ref 0.44–1.00)
Calcium: 9.3 mg/dL (ref 8.9–10.3)
Chloride: 104 mmol/L (ref 101–111)
GFR calc Af Amer: >60 mL/min (ref >60)
GLUCOSE: 111 mg/dL — AB (ref 65–99)
Potassium: 4.2 mmol/L (ref 3.5–5.1)
Sodium: 140 mmol/L (ref 135–145)
TOTAL PROTEIN: 6.7 g/dL (ref 6.5–8.1)

## 2016-01-19 MED ORDER — HYDROXYZINE HCL 50 MG PO TABS
50.0000 mg | ORAL_TABLET | Freq: Four times a day (QID) | ORAL | Status: DC | PRN
  Administered 2016-01-19 – 2016-01-20 (×3): 50 mg via ORAL
  Filled 2016-01-19 (×2): qty 1
  Filled 2016-01-19: qty 5
  Filled 2016-01-19: qty 1

## 2016-01-19 MED ORDER — SERTRALINE HCL 25 MG PO TABS
25.0000 mg | ORAL_TABLET | Freq: Every day | ORAL | Status: DC
  Administered 2016-01-19 – 2016-01-20 (×2): 25 mg via ORAL
  Filled 2016-01-19 (×3): qty 1
  Filled 2016-01-19: qty 7
  Filled 2016-01-19: qty 1

## 2016-01-19 MED ORDER — HYDROXYZINE HCL 50 MG PO TABS: 50.0000 mg | ORAL_TABLET | Freq: Four times a day (QID) | ORAL | Status: DC | PRN

## 2016-01-19 MED ORDER — FLUCONAZOLE 150 MG PO TABS
150.0000 mg | ORAL_TABLET | Freq: Once | ORAL | Status: AC
  Administered 2016-01-19: 150 mg via ORAL
  Filled 2016-01-19 (×2): qty 1

## 2016-01-19 NOTE — Progress Notes (Addendum)
EKG COMPLETED, SHOWN TO NP, PLACED ON TOP OF PATIENT'S CHART.

## 2016-01-19 NOTE — Plan of Care (Signed)
Problem: Consults Goal: Depression Patient Education See Patient Education Module for education specifics.  Outcome: Progressing Nurse discussed depression/coping skills with patient.        

## 2016-01-19 NOTE — Progress Notes (Addendum)
D:  Patient's self inventory sheet, patient has poor sleep, no sleep medication given.  Good appetite, normal energy level, good concentration.  Denied depression and hopeless, anxiety #3.  Denied SI.  Physical problems, pain, lower back, wrist, worst pain in past 24 hours #6.  Goal is "structure".  Plans to "keep focused on the task at hand when I leave.  I have received the best therapy here through the interaction and fellowship of staff and other patients." A:  Medications administered per MD orders.  Emotional support and encouragement given patient. R:  Denied SI and HI, contracts for safety.  Denied A/V hallucinations.  Safety maintained with 15 minute checks.  Patient stated she wants Diflucan for yeast infection caused by antibiotic.

## 2016-01-19 NOTE — Progress Notes (Signed)
NP stated she will talk to patient.  NP will discuss 72 hr request for discharge with patient.

## 2016-01-19 NOTE — [facility] Group Notes (Signed)
[facility] Group Notes:  (Clinical Social Work)   08/04/2015     10:00-11:00AM  Summary of Progress/Problems:   In today's process group a decisional balance exercise was used to explore in depth the perceived benefits and costs of alcohol and drugs, as well as the  benefits and costs of replacing these with healthy coping skills.  Patients listed healthy and unhealthy coping techniques, determining with CSW guidance that unhealthy coping techniques work initially, but eventually become harmful.  Motivational Interviewing and the whiteboard were utilized for the exercises.  The patient expressed that the unhealthy coping she often uses is alcohol and drugs, and she spent much of group supporting other patients, letting them know she has had similar experiences such as being in prison and going for drugs as soon as she got out.  She was calm throughout group, but kept saying she was to discharge today and is going to try to contact another patient's father for the other patient.  When CSW told her a discharge plan for today had not been relayed to CSW, she stated that she "will get out of here one way or another today, because Dr. Dub MikesLugo told me I could, if you know what I mean."  Type of Therapy:  Group Therapy - Process   Participation Level:  Active  Participation Quality:  Attentive, Sharing and Supportive  Affect:  Appropriate  Cognitive:  Appropriate  Insight:  Developing/Improving  Engagement in Therapy:  Engaged  Modes of Intervention:  Education, Motivational Interviewing  Courtney MantleMareida Grossman-Orr, LCSW 01/19/2016, 12:41 PM

## 2016-01-19 NOTE — Progress Notes (Signed)
Patient did attend the evening speaker AA meeting.  

## 2016-01-19 NOTE — Progress Notes (Signed)
[facility] MD Progress Note  01/19/2016 12:13 PM Courtney Ponce  MRN:  [id number]   Subjective: Patient reports " I am feeling better and I am ready to leave."   Objective: Courtney Ponce is awake, alert and oriented X3, found sitting in the dayroom interacting with peers.  Denies suicidal or homicidal ideation. Denies auditory or visual hallucination and does not appear to be responding to internal stimuli. Patient reports she is medication compliant. Patient reports she wants to start taking something for her depression.  States her depression 5/10. Patient states ' I rather be discharged than to start new medications" Patient reportsd ischarge and irration. Patient reports the is common while taken anabiotics.  Reports good appetite and states is resting well. Support, encouragement and reassurance was provided.   Principal Problem: Severe major depression without psychotic features ([place]) Diagnosis:   Patient Active Problem List   Diagnosis Date Noted  . Severe major depression without psychotic features ([place]) [F32.2] 01/15/2016  . Suicide attempt ([place]) [T14.91] 01/14/2016  . Cocaine use disorder, severe, dependence ([place]) [F14.20] 01/12/2016  . Cocaine abuse with cocaine-induced mood disorder ([place]) [F14.14] 01/12/2016  . Suicide attempt by drug ingestion ([place]) [T50.902A] 01/12/2016  . QT prolongation [I45.81] 01/11/2016  . SSRI overdose [T43.221A] 01/11/2016  . Overdose [T50.901A]   . Wheezing [R06.2]   . UTI (lower urinary tract infection) [N39.0] 12/13/2015  . Cocaine abuse [F14.10] 12/13/2015  . Muscle weakness (generalized) [M62.81] 12/07/2013  . Lack of coordination [R27.9] 12/07/2013  . Wrist fracture, right [S62.101A] 11/24/2013  . Right wrist fracture [S62.101A] 10/17/2013  . Distal radius fracture [S52.509A] 10/10/2013   Total Time spent with patient: 20 minutes  Past Psychiatric History: see admission H and P Past Medical History:  Past Medical History  Diagnosis Date  .  Anxiety   . Depression   . Abnormal Pap smear   . GERD (gastroesophageal reflux disease)   . Neuromuscular disorder ([place])   . Chronic back pain   . Kidney infection     Past Surgical History  Procedure Laterality Date  . Wisdom tooth extraction    . Cystoscopy with urethral dilatation      age 12  . Colposcopy  2002   Family History:  Family History  Problem Relation Age of Onset  . Cancer Mother     breast w/metastasis to liver,lung,bone, brain  . Hypertension Sister   . Thyroid disease Sister   . Cancer Maternal Aunt     ovarian w/metastasis to colon   Family Psychiatric  History: See admission H and P Social History:  History  Alcohol Use  . Yes    Comment: socially     History  Drug Use  . Yes  . Special: Cocaine    Comment: lost 2 weeks ago    Social History   Social History  . Marital Status: Single    Spouse Name: N/A  . Number of Children: N/A  . Years of Education: N/A   Social History Main Topics  . Smoking status: Current Every Day Smoker -- 1.00 packs/day for 22 years    Types: Cigarettes  . Smokeless tobacco: Never Used  . Alcohol Use: Yes     Comment: socially  . Drug Use: Yes    Special: Cocaine     Comment: lost 2 weeks ago  . Sexual Activity: Yes    Birth Control/ Protection: Injection   Other Topics Concern  . None   Social History Narrative   Additional  Social History:                         Sleep: Fair  Appetite:  Fair  Current Medications: Current Facility-Administered Medications  Medication Dose Route Frequency Provider Last Rate Last Dose  . acetaminophen (TYLENOL) tablet 650 mg  650 mg Oral Q6H PRN Niel Hummer, NP   650 mg at 01/17/16 1953  . alum & mag hydroxide-simeth (MAALOX/MYLANTA) 200-200-20 MG/5ML suspension 30 mL  30 mL Oral Q4H PRN Niel Hummer, NP      . docusate sodium (COLACE) capsule 100 mg  100 mg Oral Daily Encarnacion Slates, NP   100 mg at 01/19/16 0810  . feeding supplement (ENSURE ENLIVE)  (ENSURE ENLIVE) liquid 237 mL  237 mL Oral BID BM Nicholaus Bloom, MD   237 mL at 01/19/16 1130  . folic acid (FOLVITE) tablet 1 mg  1 mg Oral Daily Niel Hummer, NP   1 mg at 01/19/16 0810  . gabapentin (NEURONTIN) capsule 300 mg  300 mg Oral TID Nicholaus Bloom, MD   300 mg at 01/19/16 1129  . gabapentin (NEURONTIN) capsule 300 mg  300 mg Oral QHS Nicholaus Bloom, MD   300 mg at 01/18/16 2220  . hydrOXYzine (ATARAX/VISTARIL) tablet 50 mg  50 mg Oral QHS PRN Niel Hummer, NP   50 mg at 01/17/16 2113  . magnesium hydroxide (MILK OF MAGNESIA) suspension 30 mL  30 mL Oral Daily PRN Niel Hummer, NP      . multivitamin with minerals tablet 1 tablet  1 tablet Oral Daily Niel Hummer, NP   1 tablet at 01/19/16 [phone]  . nicotine polacrilex (NICORETTE) gum 2 mg  2 mg Oral PRN Nicholaus Bloom, MD   2 mg at 01/17/16 2035  . nitrofurantoin (macrocrystal-monohydrate) (MACROBID) capsule 100 mg  100 mg Oral Q12H Niel Hummer, NP   100 mg at 01/19/16 0814    Lab Results:  Results for orders placed or performed during the hospital encounter of 01/14/16 (from the past 48 hour(s))  Comprehensive metabolic panel     Status: Abnormal   Collection Time: 01/19/16  6:18 AM  Result Value Ref Range   Sodium 140 135 - 145 mmol/L   Potassium 4.2 3.5 - 5.1 mmol/L   Chloride 104 101 - 111 mmol/L   CO2 28 22 - 32 mmol/L   Glucose, Bld 111 (H) 65 - 99 mg/dL   BUN 17 6 - 20 mg/dL   Creatinine, Ser 0.67 0.44 - 1.00 mg/dL   Calcium 9.3 8.9 - 10.3 mg/dL   Total Protein 6.7 6.5 - 8.1 g/dL   Albumin 3.5 3.5 - 5.0 g/dL   AST 41 15 - 41 U/L   ALT 46 14 - 54 U/L   Alkaline Phosphatase 82 38 - 126 U/L   Total Bilirubin 0.3 0.3 - 1.2 mg/dL   GFR calc non Af Amer >60 >60 mL/min   GFR calc Af Amer >60 >60 mL/min    Comment: (NOTE) The eGFR has been calculated using the CKD EPI equation. This calculation has not been validated in all clinical situations. eGFR's persistently <60 mL/min signify possible Chronic Kidney Disease.     Anion gap 8 5 - 15    Comment: Performed at [facility]  TSH     Status: None   Collection Time: 01/19/16  6:18 AM  Result Value Ref Range  TSH 2.375 0.350 - 4.500 uIU/mL    Comment: Performed at [facility]    Blood Alcohol level:  Lab Results  Component Value Date   [facility] <5 01/11/2016   ETH <11 11/29/2011    Physical Findings: AIMS: Facial and Oral Movements Muscles of Facial Expression: None, normal Lips and Perioral Area: None, normal Jaw: None, normal Tongue: None, normal,Extremity Movements Upper (arms, wrists, hands, fingers): None, normal Lower (legs, knees, ankles, toes): None, normal, Trunk Movements Neck, shoulders, hips: None, normal, Overall Severity Severity of abnormal movements (highest score from questions above): None, normal Incapacitation due to abnormal movements: None, normal Patient's awareness of abnormal movements (rate only patient's report): No Awareness, Dental Status Current problems with teeth and/or dentures?: No Does patient usually wear dentures?: No  CIWA:  CIWA-Ar Total: 1 COWS:  COWS Total Score: 2  Musculoskeletal: Strength & Muscle Tone: within normal limits Gait & Station: normal Patient leans: normal  Psychiatric Specialty Exam: Review of Systems  Psychiatric/Behavioral: Positive for substance abuse. The patient is nervous/anxious.   All other systems reviewed and are negative.   Blood pressure 115/86, pulse 87, temperature 98.9 F (37.2 C), temperature source Oral, resp. rate 18, height '5\' 3"'$  (1.6 m), weight 61.689 kg (136 lb), SpO2 100 %.Body mass index is 24.1 kg/(m^2).  General Appearance: Fairly Groomed  Engineer, water::  Fair  Speech:  Clear and Coherent  Volume:  Normal  Mood:  Anxious and Depressed  Affect:  Appropriate  Thought Process:  Coherent and Goal Directed  Orientation:  Full (Time, Place, and Person)  Thought Content:  symptoms events worries concerns  Suicidal  Thoughts:  No  Homicidal Thoughts:  No  Memory:  Immediate;   Fair Recent;   Fair Remote;   Fair  Judgement:  Fair  Insight:  Present  Psychomotor Activity:  Normal  Concentration:  Fair  Recall:  [employer] [place]  Language: Fair  Akathisia:  No  Handed:  Right  AIMS (if indicated):     Assets:  Desire for Improvement Housing  ADL's:  Intact  Cognition: WNL  Sleep:  Number of Hours: 5.75    I agree with current treatment plan on 01/19/2016, Patient seen face-to-face for psychiatric evaluation follow-up, chart reviewed. Reviewed the information documented and agree with the treatment plan.  Treatment Plan Summary: Daily contact with patient to assess and evaluate symptoms and progress in treatment and Medication management   Supportive approach/coping skills Cocaine dependence; work a relapse prevention plan Start Zoloft 25 mg PO QD for depression/anxeity with titration  One time dose of diflucan 150 mg for possible yestinfection Mood instability; continue the Neurontin 300 mg QID and optimize dose-response Will D/C the Trazodone and the Vistaril as a way of reducing meds that could  prolong the QT Will get a magnesium level and TSH as recommended by internal med Work with CBT/mindfulness  Derrill [place], NP 01/19/2016, 12:13 PM  Agree with NP note as above  Neita Garnet, MD

## 2016-01-19 NOTE — [facility] Group Notes (Signed)

## 2016-01-19 NOTE — Progress Notes (Signed)
Pt. Continues to voice being upset that she cannot leave.  "I had it in my mind that I was leaving, my ride is even here."  She reports that she doesn't have the trust in the doctors and has put a bad taste in her mouth about being here.  Encouraged her to discuss with the doctor about her concerns.  She did state that she is aware that she is not leaving today and has signed a 72 hour request for d/c signed at 1400.  PRN obtained by Hillery Jacksanika Lewis NP for vistaril prn.  She states that she is going to be ok and that she will not get upset and take it out of the nursing staff because "you guys have been wonderful.  I like the food and the staff will do anything if I ask."  We will continue to monitor the progress towards her goals.

## 2016-01-19 NOTE — Progress Notes (Signed)
D: Pt denies SI/HI/AV hallucinations. Pt is pleasant and cooperative. Pt goal for today is to stay focus. A: Pt was offered support and encouragement. Pt was given scheduled medications. Pt was encourage to attend groups. Q 15 minute checks were done for safety.  R:Pt attends groups and interacts well with peers and staff. Pt is taking medication. Pt has no complaints.Pt receptive to treatment and safety maintained on unit.

## 2016-01-20 ENCOUNTER — Other Ambulatory Visit: Payer: Self-pay

## 2016-01-20 MED ORDER — HYDROXYZINE HCL 50 MG PO TABS: 50.0000 mg | ORAL_TABLET | Freq: Four times a day (QID) | ORAL | Status: DC | PRN

## 2016-01-20 MED ORDER — GABAPENTIN 300 MG PO CAPS: 300.0000 mg | ORAL_CAPSULE | Freq: Three times a day (TID) | ORAL | Status: DC

## 2016-01-20 MED ORDER — NICOTINE POLACRILEX 2 MG MT GUM: 2.0000 mg | CHEWING_GUM | OROMUCOSAL | Status: DC | PRN

## 2016-01-20 MED ORDER — SERTRALINE HCL 25 MG PO TABS: 25.0000 mg | ORAL_TABLET | Freq: Every day | ORAL | Status: DC

## 2016-01-20 MED ORDER — NITROFURANTOIN MONOHYD MACRO 100 MG PO CAPS: 100.0000 mg | ORAL_CAPSULE | Freq: Two times a day (BID) | ORAL | Status: DC

## 2016-01-20 NOTE — Progress Notes (Addendum)
Angila was D/C from the unit accompanied by friend.  She was pleasant and cooperative. She voiced no SI/HI.  Alert and oriented X 3.  Affect bright.  D/C follow up paperwork reviewed with pt and copy sent as well as prescriptions.  Deonne came in with no belongings. Q 15 min checks maintained until discharge.  She left the unit in no apparent distress.

## 2016-01-20 NOTE — Progress Notes (Signed)
  [facility]  Will you be returning to the same living situation after discharge:  No. At discharge, do you have transportation home?: Yes,  has a ride Do you have the ability to pay for your medications: No.  Release of information consent forms completed and in the chart;  Patient's signature needed at discharge.  Patient to Follow up at: Follow-up Information    [facility] up with [facility]   Specialty:  Behavioral Health   Why:  Please use walk in clinic to establish for services for therapy and medications management.  Hours are Mon/Wed/Fri from 9 - 11 AM.     Contact information:   [address] [place] [address] [phone]       Next level of care provider has [facility] Link:no  Safety Planning and Suicide Prevention discussed: No.  Only with patient, as refused with family.  Have you used any form of tobacco in the last 30 days? (Cigarettes, Smokeless Tobacco, Cigars, and/or Pipes): Yes  Has patient been referred to the Quitline?: Yes, faxed on 01/20/16  Patient has been referred for addiction treatment: Yes  Sarina SerGrossman-Orr, Teckla Christiansen Jo 01/20/2016, 11:31 AM
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
## 2016-01-20 NOTE — [facility] Suicide Risk Assessment (Signed)
[facility] Discharge Suicide Risk Assessment   Principal Problem: Severe major depression without psychotic features [facility]) Discharge Diagnoses:  Patient Active Problem List   Diagnosis Date Noted  . Severe major depression without psychotic features (HCC) [F32.2] 01/15/2016  . Suicide attempt (HCC) [T14.91] 01/14/2016  . Cocaine use disorder, severe, dependence (HCC) [F14.20] 01/12/2016  . Cocaine abuse with cocaine-induced mood disorder (HCC) [F14.14] 01/12/2016  . Suicide attempt by drug ingestion (HCC) [T50.902A] 01/12/2016  . QT prolongation [I45.81] 01/11/2016  . SSRI overdose [T43.221A] 01/11/2016  . Overdose [T50.901A]   . Wheezing [R06.2]   . UTI (lower urinary tract infection) [N39.0] 12/13/2015  . Cocaine abuse [F14.10] 12/13/2015  . Muscle weakness (generalized) [M62.81] 12/07/2013  . Lack of coordination [R27.9] 12/07/2013  . Wrist fracture, right [S62.101A] 11/24/2013  . Right wrist fracture [S62.101A] 10/17/2013  . Distal radius fracture [S52.509A] 10/10/2013    Total Time spent with patient: 30 minutes  Musculoskeletal: Strength & Muscle Tone: within normal limits Gait & Station: normal Patient leans: N/A  Psychiatric Specialty Exam: ROS denies headaches, denies chest pain, no shortness of breath, no vomiting   Blood pressure 102/67, pulse 83, temperature 97.8 F (36.6 C), temperature source Oral, resp. rate 16, height  (1.6 m), weight 136 lb (61.689 kg), SpO2 100 %.Body mass index is 24.1 kg/(m^2).  General Appearance: Well Groomed  Eye Contact::  Good  Speech:  Normal Rate409  Volume:  Normal  Mood:  states mood is " normal", denies depression  Affect:  Appropriate and reactive   Thought Process:  Linear  Orientation:  Full (Time, Place, and Person)  Thought Content:  denies hallucinations, no delusions   Suicidal Thoughts:  No denies any suicidal ideations , denies any self injurious ideations   Homicidal Thoughts:  No denies   Memory:  recent and remote  grossly intact   Judgement:  Other:  improved   Insight:  improved   Psychomotor Activity:  Normal  Concentration:  Good  Recall:  Good  Fund of Knowledge:Good  Language: Good  Akathisia:  Negative  Handed:  Right  AIMS (if indicated):     Assets:  Communication Skills Desire for Improvement Resilience  Sleep:  Number of Hours: 5.5  Cognition: WNL  ADL's:  Intact   Mental Status Per Nursing Assessment::   On Admission:  NA  Demographic Factors:  40 year old single female, has one adult daughter,  is going to go live with a friend, currently unemployed, but states she plans to get a job son .  Loss Factors: History of cocaine dependence, had relapsed several weeks ago, history of depression, history of anxiety  Historical Factors: History of cocaine abuse, history of depression   Risk Reduction Factors:   Sense of responsibility to family and Positive coping skills or problem solving skills  Continued Clinical Symptoms:  At this time patient is alert, attentive, well related, pleasant, no thought disorder, no SI or HI, no psychotic symptoms , future oriented. Denies medication side effects.   Cognitive Features That Contribute To Risk:  No gross cognitive deficits noted upon discharge. Is alert , attentive, and oriented x 3   Suicide Risk:  Mild:  Suicidal ideation of limited frequency, intensity, duration, and specificity.  There are no identifiable plans, no associated intent, mild dysphoria and related symptoms, good self-control (both objective and subjective assessment), few other risk factors, and identifiable protective factors, including available and accessible social support.  Follow-up Information    [facility] with [facility]   Specialty:  Behavioral Health   Why:  Please use walk in clinic to establish for services for therapy and medications management.  Hours are Mon/Wed/Fri from 9 - 11 AM.     Contact information:   [address] [place] [address] [phone]       Plan Of Care/Follow-up recommendations:  Activity:  as tolerated  Diet:  Regular  Tests:  NA Other:  see below  Patient is leaving in good services Plans to follow up as above. Nehemiah MassedOBOS, Sindi Beckworth, MD 01/20/2016, 9:47 AM

## 2016-01-20 NOTE — [facility] Group Notes (Signed)
[facility] Group Notes: (Clinical Social Work)   01/20/2016      Type of Therapy:  Group Therapy   Participation Level:  Did Not Attend despite MHT prompting   Ambrose MantleMareida Grossman-Orr, LCSW 01/20/2016, 11:30 AM

## 2016-03-06 ENCOUNTER — Emergency Department (HOSPITAL_COMMUNITY)
Admission: EM | Admit: 2016-03-06 | Discharge: 2016-03-06 | Disposition: A | Discharge status: Home / Self Care | Payer: Self-pay | Attending: Emergency Medicine | Admitting: Emergency Medicine

## 2016-03-06 ENCOUNTER — Encounter (HOSPITAL_COMMUNITY): Payer: Self-pay | Admitting: Emergency Medicine

## 2016-03-06 DIAGNOSIS — X088XXA Exposure to other specified smoke, fire and flames, initial encounter: Secondary | ICD-10-CM | POA: Insufficient documentation

## 2016-03-06 DIAGNOSIS — Y929 Unspecified place or not applicable: Secondary | ICD-10-CM | POA: Insufficient documentation

## 2016-03-06 DIAGNOSIS — Y999 Unspecified external cause status: Secondary | ICD-10-CM | POA: Insufficient documentation

## 2016-03-06 DIAGNOSIS — S60420A Blister (nonthermal) of right index finger, initial encounter: Secondary | ICD-10-CM | POA: Insufficient documentation

## 2016-03-06 DIAGNOSIS — F329 Major depressive disorder, single episode, unspecified: Secondary | ICD-10-CM | POA: Insufficient documentation

## 2016-03-06 DIAGNOSIS — Y939 Activity, unspecified: Secondary | ICD-10-CM | POA: Insufficient documentation

## 2016-03-06 DIAGNOSIS — S70322A Blister (nonthermal), left thigh, initial encounter: Secondary | ICD-10-CM

## 2016-03-06 DIAGNOSIS — M549 Dorsalgia, unspecified: Secondary | ICD-10-CM | POA: Insufficient documentation

## 2016-03-06 DIAGNOSIS — T24212A Burn of second degree of left thigh, initial encounter: Secondary | ICD-10-CM | POA: Insufficient documentation

## 2016-03-06 DIAGNOSIS — F17210 Nicotine dependence, cigarettes, uncomplicated: Secondary | ICD-10-CM | POA: Insufficient documentation

## 2016-03-06 DIAGNOSIS — L089 Local infection of the skin and subcutaneous tissue, unspecified: Secondary | ICD-10-CM | POA: Insufficient documentation

## 2016-03-06 MED ORDER — TRAMADOL HCL 50 MG PO TABS: 50.0000 mg | ORAL_TABLET | Freq: Four times a day (QID) | ORAL | Status: DC | PRN

## 2016-03-06 MED ORDER — CEFTRIAXONE SODIUM 1 G IJ SOLR
1.0000 g | Freq: Once | INTRAMUSCULAR | Status: AC
Start: 2016-03-06 — End: 2016-03-06
  Administered 2016-03-06: 1 g via INTRAMUSCULAR
  Filled 2016-03-06: qty 10

## 2016-03-06 MED ORDER — CLINDAMYCIN HCL 150 MG PO CAPS: 150.0000 mg | ORAL_CAPSULE | Freq: Four times a day (QID) | ORAL | Status: DC

## 2016-03-06 MED ORDER — IBUPROFEN 800 MG PO TABS
800.0000 mg | ORAL_TABLET | Freq: Once | ORAL | Status: AC
  Administered 2016-03-06: 800 mg via ORAL
  Filled 2016-03-06: qty 1

## 2016-03-06 MED ORDER — LIDOCAINE HCL (PF) 1 % IJ SOLN
2.1000 mL | Freq: Once | INTRAMUSCULAR | Status: AC
  Administered 2016-03-06: 2.1 mL
  Filled 2016-03-06: qty 5

## 2016-03-06 NOTE — Discharge Instructions (Signed)
Please cleanse the wound on your hands in your leg daily with soap and water, and apply Band-Aid. Use clindamycin with breakfast, lunch, dinner, and at bedtime until all taken. Use Tylenol or ibuprofen for mild pain, use Ultram for more severe pain. Ultram may cause drowsiness, please do not drink alcohol, drive a vehicle, operating machinery, or participate in activities requiring concentration when using this medication.

## 2016-03-06 NOTE — ED Notes (Signed)
Pt made aware to return if symptoms worsen or if any life threatening symptoms occur.   
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
## 2017-05-22 ENCOUNTER — Encounter (HOSPITAL_COMMUNITY): Payer: Self-pay

## 2017-05-22 ENCOUNTER — Emergency Department (HOSPITAL_COMMUNITY)
Admission: EM | Admit: 2017-05-22 | Discharge: 2017-05-22 | Disposition: A | Discharge status: Home / Self Care | Payer: Self-pay | Attending: Emergency Medicine | Admitting: Emergency Medicine

## 2017-05-22 ENCOUNTER — Emergency Department (HOSPITAL_COMMUNITY): Payer: Self-pay

## 2017-05-22 DIAGNOSIS — Z79899 Other long term (current) drug therapy: Secondary | ICD-10-CM | POA: Insufficient documentation

## 2017-05-22 DIAGNOSIS — S80259A Superficial foreign body, unspecified knee, initial encounter: Secondary | ICD-10-CM

## 2017-05-22 DIAGNOSIS — Y9302 Activity, running: Secondary | ICD-10-CM | POA: Insufficient documentation

## 2017-05-22 DIAGNOSIS — Y929 Unspecified place or not applicable: Secondary | ICD-10-CM | POA: Insufficient documentation

## 2017-05-22 DIAGNOSIS — W25XXXA Contact with sharp glass, initial encounter: Secondary | ICD-10-CM | POA: Insufficient documentation

## 2017-05-22 DIAGNOSIS — Z1881 Retained glass fragments: Secondary | ICD-10-CM | POA: Insufficient documentation

## 2017-05-22 DIAGNOSIS — W458XXA Other foreign body or object entering through skin, initial encounter: Secondary | ICD-10-CM | POA: Insufficient documentation

## 2017-05-22 DIAGNOSIS — S80251A Superficial foreign body, right knee, initial encounter: Secondary | ICD-10-CM | POA: Insufficient documentation

## 2017-05-22 DIAGNOSIS — Z23 Encounter for immunization: Secondary | ICD-10-CM | POA: Insufficient documentation

## 2017-05-22 DIAGNOSIS — Y998 Other external cause status: Secondary | ICD-10-CM | POA: Insufficient documentation

## 2017-05-22 DIAGNOSIS — F17210 Nicotine dependence, cigarettes, uncomplicated: Secondary | ICD-10-CM | POA: Insufficient documentation

## 2017-05-22 MED ORDER — CEPHALEXIN 500 MG PO CAPS: 500.0000 mg | ORAL_CAPSULE | Freq: Two times a day (BID) | ORAL | 0 refills | Status: DC

## 2017-05-22 MED ORDER — HYDROMORPHONE HCL 1 MG/ML IJ SOLN
1.0000 mg | Freq: Once | INTRAMUSCULAR | Status: AC
  Administered 2017-05-22: 1 mg via INTRAMUSCULAR
  Filled 2017-05-22: qty 1

## 2017-05-22 MED ORDER — LIDOCAINE HCL (PF) 1 % IJ SOLN
5.0000 mL | Freq: Once | INTRAMUSCULAR | Status: AC
  Administered 2017-05-22: 5 mL
  Filled 2017-05-22: qty 5

## 2017-05-22 MED ORDER — TETANUS-DIPHTH-ACELL PERTUSSIS 5-2.5-18.5 LF-MCG/0.5 IM SUSP
0.5000 mL | Freq: Once | INTRAMUSCULAR | Status: AC
  Administered 2017-05-22: 0.5 mL via INTRAMUSCULAR
  Filled 2017-05-22: qty 0.5

## 2017-05-22 MED ORDER — HYDROCODONE-ACETAMINOPHEN 5-325 MG PO TABS
1.0000 | ORAL_TABLET | Freq: Once | ORAL | Status: AC
  Administered 2017-05-22: 1 via ORAL
  Filled 2017-05-22: qty 1

## 2017-05-22 MED ORDER — CEPHALEXIN 500 MG PO CAPS
500.0000 mg | ORAL_CAPSULE | Freq: Once | ORAL | Status: AC
  Administered 2017-05-22: 500 mg via ORAL
  Filled 2017-05-22: qty 1

## 2017-05-22 NOTE — ED Provider Notes (Signed)
[facility]-EMERGENCY DEPT Provider Note   CSN: [id number] Arrival date & time: 05/22/17  0128     History   Chief Complaint Chief Complaint  Patient presents with  . Foreign Body in Skin    shard of glass in knee   Patient gave verbal permission to utilize photo for medical documentation only The image was not stored on any personal device  HPI Courtney Ponce is a 41 y.o. female.  The history is provided by the patient.  Knee Pain   This is a new problem. The current episode started more than 1 week ago. The problem occurs daily. The problem has been gradually worsening. The pain is present in the right knee. The quality of the pain is described as sharp. The pain is severe. Associated symptoms include limited range of motion. She has tried rest for the symptoms. The treatment provided no relief.   Pt reports she ran into glass with her right knee about 2 weeks ago She did not seek medical care at that time Since the pain has worsened and now she has glass "poking" through her skin No other acute complaints Denies fever/chills No weakness/numbness in her legs  Past Medical History:  Diagnosis Date  . Abnormal Pap smear   . Anxiety   . Chronic back pain   . Depression   . GERD (gastroesophageal reflux disease)   . Kidney infection   . Neuromuscular disorder [facility])     Patient Active Problem List   Diagnosis Date Noted  . Severe major depression without psychotic features (HCC) 01/15/2016  . Suicide attempt (HCC) 01/14/2016  . Cocaine use disorder, severe, dependence (HCC) 01/12/2016  . Cocaine abuse with cocaine-induced mood disorder (HCC) 01/12/2016  . Suicide attempt by drug ingestion (HCC) 01/12/2016  . QT prolongation 01/11/2016  . SSRI overdose 01/11/2016  . Overdose   . Wheezing   . UTI (lower urinary tract infection) 12/13/2015  . Cocaine abuse 12/13/2015  . Muscle weakness (generalized) 12/07/2013  . Lack of coordination 12/07/2013  . Wrist fracture, right  11/24/2013  . Right wrist fracture 10/17/2013  . Distal radius fracture 10/10/2013    Past Surgical History:  Procedure Laterality Date  . COLPOSCOPY  2002  . CYSTOSCOPY WITH URETHRAL DILATATION     age 51  . WISDOM TOOTH EXTRACTION      OB History    Gravida Para Term Preterm AB Living   1 1 1     1    SAB TAB Ectopic Multiple Live Births                   Home Medications    Prior to Admission medications   Medication Sig Start Date End Date Taking? Authorizing Provider  clindamycin (CLEOCIN) 150 MG capsule Take 1 capsule (150 mg total) by mouth 4 (four) times daily. 03/06/16   Ivery Quale, PA-C  gabapentin (NEURONTIN) 300 MG capsule Take 1 capsule (300 mg total) by mouth 3 (three) times daily. Take 2 capsules (600mg ) by mouth at bedtime Patient not taking: Reported on 03/06/2016 01/20/16   Oneta Rack, NP  hydrOXYzine (ATARAX/VISTARIL) 50 MG tablet Take 1 tablet (50 mg total) by mouth every 6 (six) hours as needed for anxiety (sleep). Patient not taking: Reported on 03/06/2016 01/20/16   Oneta Rack, NP  nicotine polacrilex (NICORETTE) 2 MG gum Take 1 each (2 mg total) by mouth as needed for smoking cessation. Patient not taking: Reported on 03/06/2016 01/20/16   Hillery Jacks  N, NP  nitrofurantoin, macrocrystal-monohydrate, (MACROBID) 100 MG capsule Take 1 capsule (100 mg total) by mouth every 12 (twelve) hours. Patient not taking: Reported on 03/06/2016 01/20/16   Oneta Rack, NP  sertraline (ZOLOFT) 25 MG tablet Take 1 tablet (25 mg total) by mouth daily. Patient not taking: Reported on 03/06/2016 01/20/16   Oneta Rack, NP  traMADol (ULTRAM) 50 MG tablet Take 1 tablet (50 mg total) by mouth every 6 (six) hours as needed. 03/06/16   Ivery Quale, PA-C    Family History Family History  Problem Relation Age of Onset  . Cancer Mother        breast w/metastasis to liver,lung,bone, brain  . Hypertension Sister   . Thyroid disease Sister   . Cancer Maternal Aunt         ovarian w/metastasis to colon    Social History Social History  Substance Use Topics  . Smoking status: Current Every Day Smoker    Packs/day: 1.00    Years: 22.00    Types: Cigarettes  . Smokeless tobacco: Never Used  . Alcohol use Yes     Comment: socially     Allergies   Shellfish allergy and Wellbutrin [bupropion hcl]   Review of Systems Review of Systems  Constitutional: Negative for fever.  Cardiovascular: Negative for chest pain.  Gastrointestinal: Negative for vomiting.  Musculoskeletal: Positive for arthralgias.  Skin: Positive for wound.  All other systems reviewed and are negative.    Physical Exam Updated Vital Signs BP (!) 145/107 (BP Location: Right Arm)   Pulse 89   Temp 98.1 F (36.7 C) (Oral)   Resp 15   LMP 04/21/2017   SpO2 100%   Physical Exam CONSTITUTIONAL: Disheveled, anxious HEAD: Normocephalic/atraumatic EYES: EOMI ENMT: Mucous membranes moist NECK: supple no meningeal signs LUNGS:   no apparent distress ABDOMEN: soft NEURO: Pt is awake/alert/appropriate, moves all extremitiesx4.  No facial droop.   EXTREMITIES: pulses normal/equal, full ROM, tenderness to right patella noted.  Distal pulses intact.  No tenderness to right ankle/foot.  Able to move toes on right foot without difficulty SKIN: warm, color normal PSYCH: anxious      ED Treatments / Results  Labs (all labs ordered are listed, but only abnormal results are displayed) Labs Reviewed - No data to display  EKG  EKG Interpretation None       Radiology Dg Knee Complete 4 Views Right  Result Date: 05/22/2017 CLINICAL DATA:  Status post removal of glass from right knee. Assess for remaining foreign body. Initial encounter. EXAM: RIGHT KNEE - COMPLETE 4+ VIEW COMPARISON:  Right knee radiographs performed earlier today at 1:50 a.m. FINDINGS: There is no evidence of fracture or dislocation. The joint spaces are preserved. No significant degenerative change is seen; the  patellofemoral joint is grossly unremarkable in appearance. A fabella is noted. No significant joint effusion is seen. Apparent tiny residual foreign bodies are noted within the soft tissue laceration inferior to the patella. IMPRESSION: 1. No evidence of fracture or dislocation. 2. Apparent tiny residual foreign bodies within the soft tissue laceration inferior to the patella. Electronically Signed   By: Roanna Raider M.D.   On: 05/22/2017 06:44   Dg Knee Complete 4 Views Right  Result Date: 05/22/2017 CLINICAL DATA:  Shard of glass in knee, fell into of window. EXAM: RIGHT KNEE - COMPLETE 4+ VIEW COMPARISON:  None. FINDINGS: Two linear radiopaque foreign bodies in infrapatellar lateral knee soft tissues measuring to at least 2 cm, with  additional surrounding punctate radiopaque foreign bodies. No acute osseous process. No destructive bony lesions. IMPRESSION: Multiple radiopaque foreign bodies within lateral knee consistent with glass foreign bodies. No acute osseous process. Electronically Signed   By: Awilda Metro M.D.   On: 05/22/2017 02:27    Procedures .Foreign Body Removal Date/Time: 05/22/2017 6:25 AM Performed by: Zadie Rhine Authorized by: Zadie Rhine  Consent: Verbal consent obtained. Risks and benefits: risks, benefits and alternatives were discussed Consent given by: patient Patient identity confirmed: verbally with patient and provided demographic data Time out: Immediately prior to procedure a "time out" was called to verify the correct patient, procedure, equipment, support staff and site/side marked as required. Body area: skin General location: lower extremity Location details: right knee Anesthesia: local infiltration  Anesthesia: Local Anesthetic: lidocaine 1% without epinephrine Anesthetic total: 5 mL  Sedation: Patient sedated: no Patient cooperative: yes Tendon involvement: none Depth: subcutaneous Complexity: complex 2 objects  recovered. Objects recovered: glass Post-procedure assessment: foreign body removed Patient tolerance: Patient tolerated the procedure well with no immediate complications   (including critical care time)  Medications Ordered in ED Medications  cephALEXin (KEFLEX) capsule 500 mg (500 mg Oral Given 05/22/17 0422)  Tdap (BOOSTRIX) injection 0.5 mL (0.5 mLs Intramuscular Given 05/22/17 0423)  HYDROcodone-acetaminophen (NORCO/VICODIN) 5-325 MG per tablet 1 tablet (1 tablet Oral Given 05/22/17 0422)  HYDROmorphone (DILAUDID) injection 1 mg (1 mg Intramuscular Given 05/22/17 0540)  lidocaine (PF) (XYLOCAINE) 1 % injection 5 mL (5 mLs Other Given 05/22/17 0541)     Initial Impression / Assessment and Plan / ED Course  I have reviewed the triage vital signs and the nursing notes.  Pertinent imaging results that were available during my care of the patient were reviewed by me and considered in my medical decision making (see chart for details).     Pt in the ED for wound to knee from when glass pierced medial aspect of knee and began protruding out of lateral aspect I was able to extract two large pieces without difficulty and I flushed out wound extensively There are two small pieces left per xray No joint effusion No erythema/drainage.  Pt feels dramatically improved No air in joint on xray At this point, no signs of septic joint I spoke via phone with Dr Romeo Apple about this case We agree to d/c home on keflex, and f/u as outpatient next week She will not require OR management at this time I discussed strict return precautions with patient  Final Clinical Impressions(s) / ED Diagnoses   Final diagnoses:  Foreign body of knee, initial encounter    New Prescriptions New Prescriptions   CEPHALEXIN (KEFLEX) 500 MG CAPSULE    Take 1 capsule (500 mg total) by mouth 2 (two) times daily.     Zadie Rhine, MD 05/22/17 [phone]

## 2017-05-22 NOTE — ED Notes (Signed)
Dr Bebe Shaggy removed 2 pieces of glass from pt's right knee without problem wound flushed awaiting xray to make sure there is no more glass present

## 2017-05-22 NOTE — ED Triage Notes (Signed)
2 weeks ago, pt fell onto a window that was broken and did not realize she had gotten glass in her knee.  Pt has a long shard of glass poking out from under the skin of her right knee.

## 2017-12-08 ENCOUNTER — Emergency Department (HOSPITAL_COMMUNITY): Payer: Self-pay

## 2017-12-08 ENCOUNTER — Encounter (HOSPITAL_COMMUNITY): Payer: Self-pay | Admitting: Emergency Medicine

## 2017-12-08 ENCOUNTER — Other Ambulatory Visit: Payer: Self-pay

## 2017-12-08 ENCOUNTER — Emergency Department (HOSPITAL_COMMUNITY)
Admission: EM | Admit: 2017-12-08 | Discharge: 2017-12-08 | Disposition: A | Discharge status: Other Acute Inpatient Hospital | Payer: Self-pay | Attending: Emergency Medicine | Admitting: Emergency Medicine

## 2017-12-08 DIAGNOSIS — B9689 Other specified bacterial agents as the cause of diseases classified elsewhere: Secondary | ICD-10-CM | POA: Insufficient documentation

## 2017-12-08 DIAGNOSIS — H16001 Unspecified corneal ulcer, right eye: Secondary | ICD-10-CM | POA: Insufficient documentation

## 2017-12-08 DIAGNOSIS — H5711 Ocular pain, right eye: Secondary | ICD-10-CM

## 2017-12-08 DIAGNOSIS — H1031 Unspecified acute conjunctivitis, right eye: Secondary | ICD-10-CM | POA: Insufficient documentation

## 2017-12-08 DIAGNOSIS — F17210 Nicotine dependence, cigarettes, uncomplicated: Secondary | ICD-10-CM | POA: Insufficient documentation

## 2017-12-08 LAB — BASIC METABOLIC PANEL
Anion gap: 10 (ref 5–15)
BUN: 9 mg/dL (ref 6–20)
CALCIUM: 8.7 mg/dL — AB (ref 8.9–10.3)
CO2: 24 mmol/L (ref 22–32)
Chloride: 104 mmol/L (ref 101–111)
Creatinine, Ser: 0.61 mg/dL (ref 0.44–1.00)
GFR calc Af Amer: >60 mL/min (ref >60)
GLUCOSE: 110 mg/dL — AB (ref 65–99)
Potassium: 3.7 mmol/L (ref 3.5–5.1)
Sodium: 138 mmol/L (ref 135–145)

## 2017-12-08 LAB — CBC WITH DIFFERENTIAL/PLATELET
Basophils Absolute: 0 10*3/uL (ref 0.0–0.1)
Basophils Relative: 0 %
EOS PCT: 1 %
Eosinophils Absolute: 0.1 10*3/uL (ref 0.0–0.7)
HCT: 46.3 % — ABNORMAL HIGH (ref 36.0–46.0)
Hemoglobin: 15 g/dL (ref 12.0–15.0)
LYMPHS ABS: 2 10*3/uL (ref 0.7–4.0)
LYMPHS PCT: 28 %
MCH: 32.1 pg (ref 26.0–34.0)
MCHC: 32.4 g/dL (ref 30.0–36.0)
MCV: 99.1 fL (ref 78.0–100.0)
MONO ABS: 0.4 10*3/uL (ref 0.1–1.0)
Monocytes Relative: 5 %
Neutro Abs: 4.7 10*3/uL (ref 1.7–7.7)
Neutrophils Relative %: 66 %
PLATELETS: 142 10*3/uL — AB (ref 150–400)
RBC: 4.67 MIL/uL (ref 3.87–5.11)
RDW: 12.5 % (ref 11.5–15.5)
WBC: 7.1 10*3/uL (ref 4.0–10.5)

## 2017-12-08 LAB — POC URINE PREG, ED: Preg Test, Ur: NEGATIVE

## 2017-12-08 MED ORDER — PROCHLORPERAZINE EDISYLATE 5 MG/ML IJ SOLN
10.0000 mg | Freq: Once | INTRAMUSCULAR | Status: AC
  Administered 2017-12-08: 10 mg via INTRAVENOUS
  Filled 2017-12-08: qty 2

## 2017-12-08 MED ORDER — TETRACAINE HCL 0.5 % OP SOLN
2.0000 [drp] | Freq: Once | OPHTHALMIC | Status: AC
  Administered 2017-12-08: 2 [drp] via OPHTHALMIC
  Filled 2017-12-08: qty 4

## 2017-12-08 MED ORDER — POLYMYXIN B-TRIMETHOPRIM 10000-0.1 UNIT/ML-% OP SOLN
1.0000 [drp] | OPHTHALMIC | Status: DC
  Administered 2017-12-08 (×3): 1 [drp] via OPHTHALMIC
  Filled 2017-12-08: qty 10

## 2017-12-08 MED ORDER — MORPHINE SULFATE (PF) 4 MG/ML IV SOLN
4.0000 mg | Freq: Once | INTRAVENOUS | Status: AC
  Administered 2017-12-08: 4 mg via INTRAVENOUS
  Filled 2017-12-08: qty 1

## 2017-12-08 MED ORDER — GATIFLOXACIN 0.5 % OP SOLN
1.0000 [drp] | OPHTHALMIC | Status: DC
  Administered 2017-12-08 (×3): 1 [drp] via OPHTHALMIC
  Filled 2017-12-08 (×2): qty 2.5

## 2017-12-08 MED ORDER — IOPAMIDOL (ISOVUE-300) INJECTION 61%
75.0000 mL | Freq: Once | INTRAVENOUS | Status: AC | PRN
  Administered 2017-12-08: 75 mL via INTRAVENOUS

## 2017-12-08 NOTE — ED Provider Notes (Signed)
Patient has a severe corneal infection.  [facility] Vanessa [facility]amora and [facility].  I then spoke to Dr. Vanessa BarbaraZamora and he suggested [facility]  I called up [facility] and the patient was accepted by Dr. Marline BackboneAtalie Thompson.  [facility]   Bethann BerkshireZammit, Kailan Carmen, MD 12/08/17 [phone]

## 2017-12-08 NOTE — Progress Notes (Signed)
Patient removed both earrings herself and placed in right side jean pocket

## 2017-12-08 NOTE — ED Provider Notes (Signed)
[facility] EMERGENCY DEPARTMENT Provider Note   CSN: [id number] Arrival date & time: 12/08/17  1150     History   Chief Complaint Chief Complaint  Patient presents with  . Eye Pain    HPI Courtney Ponce is a 42 y.o. female with significant psych history including substance abuse presenting with a 2 day history of right eye pain.  She endorses having a recent uri including nasal congestion with purulent blood tinged nasal discharge for the past week.  She developed right eye pain, redness and now swelling around her eye which started 2 days ago.  She has had lots of clear tearing but also with thick discharge and eyelid crusting.  Her pain is constant, worsened with attempts at moving her eyes and blinking and she endorses photophobia.  She denies injury or foreign body.  Denies contact use.  She has had no fevers, n/v or other complaint.  Her visual acuity has been reduced.  The history is provided by the patient.    Past Medical History:  Diagnosis Date  . Abnormal Pap smear   . Anxiety   . Chronic back pain   . Depression   . GERD (gastroesophageal reflux disease)   . Kidney infection   . Neuromuscular disorder [facility])     Patient Active Problem List   Diagnosis Date Noted  . Severe major depression without psychotic features (HCC) 01/15/2016  . Suicide attempt (HCC) 01/14/2016  . Cocaine use disorder, severe, dependence (HCC) 01/12/2016  . Cocaine abuse with cocaine-induced mood disorder (HCC) 01/12/2016  . Suicide attempt by drug ingestion (HCC) 01/12/2016  . QT prolongation 01/11/2016  . SSRI overdose 01/11/2016  . Overdose   . Wheezing   . UTI (lower urinary tract infection) 12/13/2015  . Cocaine abuse (HCC) 12/13/2015  . Muscle weakness (generalized) 12/07/2013  . Lack of coordination 12/07/2013  . Wrist fracture, right 11/24/2013  . Right wrist fracture 10/17/2013  . Distal radius fracture 10/10/2013    Past Surgical History:  Procedure Laterality Date  .  COLPOSCOPY  2002  . CYSTOSCOPY WITH URETHRAL DILATATION     age 15  . WISDOM TOOTH EXTRACTION      OB History    Gravida Para Term Preterm AB Living   1 1 1     1    SAB TAB Ectopic Multiple Live Births                   Home Medications    Prior to Admission medications   Medication Sig Start Date End Date Taking? Authorizing Provider  cephALEXin (KEFLEX) 500 MG capsule Take 1 capsule (500 mg total) by mouth 2 (two) times daily. 05/22/17   Zadie Rhine, MD  gabapentin (NEURONTIN) 300 MG capsule Take 1 capsule (300 mg total) by mouth 3 (three) times daily. Take 2 capsules (600mg ) by mouth at bedtime Patient not taking: Reported on 03/06/2016 01/20/16   Oneta Rack, NP  hydrOXYzine (ATARAX/VISTARIL) 50 MG tablet Take 1 tablet (50 mg total) by mouth every 6 (six) hours as needed for anxiety (sleep). Patient not taking: Reported on 03/06/2016 01/20/16   Oneta Rack, NP  nicotine polacrilex (NICORETTE) 2 MG gum Take 1 each (2 mg total) by mouth as needed for smoking cessation. Patient not taking: Reported on 03/06/2016 01/20/16   Oneta Rack, NP  nitrofurantoin, macrocrystal-monohydrate, (MACROBID) 100 MG capsule Take 1 capsule (100 mg total) by mouth every 12 (twelve) hours. Patient not taking: Reported on 03/06/2016  01/20/16   Oneta Rack, NP  sertraline (ZOLOFT) 25 MG tablet Take 1 tablet (25 mg total) by mouth daily. Patient not taking: Reported on 03/06/2016 01/20/16   Oneta Rack, NP    Family History Family History  Problem Relation Age of Onset  . Cancer Mother        breast w/metastasis to liver,lung,bone, brain  . Hypertension Sister   . Thyroid disease Sister   . Cancer Maternal Aunt        ovarian w/metastasis to colon    Social History Social History   Tobacco Use  . Smoking status: Current Every Day Smoker    Packs/day: 1.00    Years: 22.00    Pack years: 22.00    Types: Cigarettes  . Smokeless tobacco: Never Used  Substance Use Topics  . Alcohol  use: Yes    Comment: socially  . Drug use: Yes    Types: Cocaine    Comment: Marijuana 3 days, cocaine a week ago     Allergies   Shellfish allergy and Wellbutrin [bupropion hcl]   Review of Systems Review of Systems  Constitutional: Negative for chills and fever.  HENT: Positive for congestion, rhinorrhea, sinus pressure and voice change. Negative for ear pain, sore throat and trouble swallowing.   Eyes: Positive for photophobia, pain, discharge, redness and visual disturbance.  Respiratory: Negative for cough, shortness of breath, wheezing and stridor.   Cardiovascular: Negative for chest pain.  Gastrointestinal: Negative for abdominal pain.  Genitourinary: Negative.      Physical Exam Updated Vital Signs BP (!) 153/93   Pulse 71   Temp 98.2 F (36.8 C) (Oral)   Resp 16   Ht 5\' 3"  (1.6 m)   Wt 63.5 kg (140 lb)   LMP 12/01/2017   SpO2 96%   BMI 24.80 kg/m   Physical Exam  Constitutional: She is oriented to person, place, and time. She appears well-developed and well-nourished.  HENT:  Head: Normocephalic and atraumatic.  Right Ear: Tympanic membrane and ear canal normal.  Left Ear: Tympanic membrane and ear canal normal.  Nose: Mucosal edema and rhinorrhea present.  Mouth/Throat: Uvula is midline, oropharynx is clear and moist and mucous membranes are normal. No oropharyngeal exudate, posterior oropharyngeal edema, posterior oropharyngeal erythema or tonsillar abscesses.  Eyes: Right conjunctiva is injected. Right eye exhibits abnormal extraocular motion.  Pain right eye with attempts at ROM. Purulent crusting of both eyelid margins.  Moderate erythema bilateral eyelids with mild edema of right upper lid. Large corneal abrasion covering nearly entire right pupil with copious thick discharge.  BOTH EYE 20/70 LEFT EYE 20/100 RIGHT EYE UNABLE TO READ ANY  IOP right 10 and 14 mmhg measured x 2.     Cardiovascular: Normal rate and normal heart sounds.    Pulmonary/Chest: Effort normal. No respiratory distress. She has no wheezes. She has no rales.  Abdominal: Soft. There is no tenderness.  Musculoskeletal: Normal range of motion.  Neurological: She is alert and oriented to person, place, and time.  Skin: Skin is warm and dry. No rash noted.  Psychiatric: She has a normal mood and affect.     ED Treatments / Results  Labs (all labs ordered are listed, but only abnormal results are displayed) Labs Reviewed  CBC WITH DIFFERENTIAL/PLATELET - Abnormal; Notable for the following components:      Result Value   HCT 46.3 (*)    Platelets 142 (*)    All other components within normal limits  BASIC METABOLIC PANEL - Abnormal; Notable for the following components:   Glucose, Bld 110 (*)    Calcium 8.7 (*)    All other components within normal limits  POC URINE PREG, ED    EKG  EKG Interpretation None       Radiology Ct Orbits W Contrast  Result Date: 12/08/2017 CLINICAL DATA:  Right-sided eye pain with swelling for 2 days. Both eyes are watering. Headache, basilar or orbital. EXAM: CT ORBITS WITH CONTRAST TECHNIQUE: Multidetector CT images was performed according to the standard protocol following intravenous contrast administration. CONTRAST:  75mL ISOVUE-300 IOPAMIDOL (ISOVUE-300) INJECTION 61% COMPARISON:  Head CT 10/06/2013 FINDINGS: Orbits: Equivocal for accentuated conjunctiva enhancement bilaterally. Normal appearance of the globes. Optic nerve sheath complexes, lacrimal glands, extraocular muscles, and superior ophthalmic veins. No postseptal inflammation or collection. Visualized sinuses: Mild patchy mucosal thickening with some secretions in the left maxillary sinus. The left maxillary infundibulum and ostium is opacified by mucosal thickening. Mild mucosal thickening in the bilateral ethmoids. Soft tissues: Negative visualized soft tissues. Limited intracranial: Negative. IMPRESSION: Questionable findings of conjunctivitis. No  postseptal inflammation or collection. Electronically Signed   By: Marnee SpringJonathon  Watts M.D.   On: 12/08/2017 16:33    Procedures Procedures (including critical care time)  Medications Ordered in ED Medications  gatifloxacin (ZYMAXID) 0.5 % ophthalmic drops 1 drop (not administered)  trimethoprim-polymyxin b (POLYTRIM) ophthalmic solution 1 drop (not administered)  tetracaine (PONTOCAINE) 0.5 % ophthalmic solution 2 drop (2 drops Right Eye Given 12/08/17 1524)  morphine 4 MG/ML injection 4 mg (4 mg Intravenous Given 12/08/17 1523)  prochlorperazine (COMPAZINE) injection 10 mg (10 mg Intravenous Given 12/08/17 1523)  iopamidol (ISOVUE-300) 61 % injection 75 mL (75 mLs Intravenous Contrast Given 12/08/17 1607)     Initial Impression / Assessment and Plan / ED Course  I have reviewed the triage vital signs and the nursing notes.  Pertinent labs & imaging results that were available during my care of the patient were reviewed by me and considered in my medical decision making (see chart for details).     Labs and CT imaging reviewed.  No orbital cellulitis.  After pt returned from Ct imaging, was comfortable enough to allow eye exam, with ulceration findings per above.  Discussed with Dr. Vanessa BarbaraZamora of ophthalmology.     Discussed with Dr Vanessa BarbaraZamora. Recommends transfer to tertiary care center such as [facility] for corneal specialist care/admission of this corneal infection. In the interim, gatifloxacin and polymyxin gtts q hourly.  Discussed with Dr Estell HarpinZammit who also saw patient during this visit.  Final Clinical Impressions(s) / ED Diagnoses   Final diagnoses:  Ulcer of right cornea  Acute bacterial conjunctivitis of right eye    ED Discharge Orders    None       Victoriano Laindol, Samyia Motter, PA-C 12/08/17 Ronni [id number]    Zammit, Joseph, MD 12/08/17 2304

## 2017-12-08 NOTE — ED Triage Notes (Signed)
Pt c/o of right eye pain 2 days ago. Swelling noted.

## 2017-12-08 NOTE — ED Notes (Signed)
BOTH EYE 20/70 LEFT EYE 20/100 RIGHT EYE UNABLE TO READ ANY

## 2017-12-22 ENCOUNTER — Emergency Department (HOSPITAL_COMMUNITY)
Admission: EM | Admit: 2017-12-22 | Discharge: 2017-12-22 | Disposition: A | Discharge status: Home / Self Care | Payer: Self-pay | Attending: Emergency Medicine | Admitting: Emergency Medicine

## 2017-12-22 ENCOUNTER — Encounter (HOSPITAL_COMMUNITY): Payer: Self-pay | Admitting: Emergency Medicine

## 2017-12-22 ENCOUNTER — Other Ambulatory Visit: Payer: Self-pay

## 2017-12-22 DIAGNOSIS — Z79899 Other long term (current) drug therapy: Secondary | ICD-10-CM | POA: Insufficient documentation

## 2017-12-22 DIAGNOSIS — H16001 Unspecified corneal ulcer, right eye: Secondary | ICD-10-CM | POA: Insufficient documentation

## 2017-12-22 DIAGNOSIS — F17210 Nicotine dependence, cigarettes, uncomplicated: Secondary | ICD-10-CM | POA: Insufficient documentation

## 2017-12-22 MED ORDER — TOBRAMYCIN 0.3 % OP SOLN
2.0000 [drp] | OPHTHALMIC | Status: DC
  Administered 2017-12-22: 2 [drp] via OPHTHALMIC
  Filled 2017-12-22: qty 5

## 2017-12-22 MED ORDER — BRIMONIDINE TARTRATE 0.2 % OP SOLN
1.0000 [drp] | OPHTHALMIC | Status: DC
  Administered 2017-12-22: 1 [drp] via OPHTHALMIC
  Filled 2017-12-22 (×2): qty 5

## 2017-12-22 NOTE — ED Provider Notes (Signed)
[facility] EMERGENCY DEPARTMENT Provider Note   CSN: [id number] Arrival date & time: 12/22/17  1746     History   Chief Complaint Chief Complaint  Patient presents with  . Eye Pain    HPI Courtney Ponce is a 42 y.o. female.  She presents for evaluation of right eye pain and "infection."  She was evaluated for the same problem, 12/08/2017 at [facility], and 12/17/2017, at [facility].  She states that she ran out of her medicines.  She is homeless, living currently in [place].  She cannot recall when her follow-up appointment is at [facility].  She denies fever, chills, nausea, vomiting, weakness or dizziness.  There are no other known modifying factors.  HPI  Past Medical History:  Diagnosis Date  . Abnormal Pap smear   . Anxiety   . Chronic back pain   . Depression   . GERD (gastroesophageal reflux disease)   . Kidney infection   . Neuromuscular disorder [facility])     Patient Active Problem List   Diagnosis Date Noted  . Severe major depression without psychotic features (HCC) 01/15/2016  . Suicide attempt (HCC) 01/14/2016  . Cocaine use disorder, severe, dependence (HCC) 01/12/2016  . Cocaine abuse with cocaine-induced mood disorder (HCC) 01/12/2016  . Suicide attempt by drug ingestion (HCC) 01/12/2016  . QT prolongation 01/11/2016  . SSRI overdose 01/11/2016  . Overdose   . Wheezing   . UTI (lower urinary tract infection) 12/13/2015  . Cocaine abuse (HCC) 12/13/2015  . Muscle weakness (generalized) 12/07/2013  . Lack of coordination 12/07/2013  . Wrist fracture, right 11/24/2013  . Right wrist fracture 10/17/2013  . Distal radius fracture 10/10/2013    Past Surgical History:  Procedure Laterality Date  . COLPOSCOPY  2002  . CYSTOSCOPY WITH URETHRAL DILATATION     age 55  . WISDOM TOOTH EXTRACTION      OB History    Gravida Para Term Preterm AB Living   1 1 1     1    SAB TAB Ectopic  Multiple Live Births                   Home Medications    Prior to Admission medications   Medication Sig Start Date End Date Taking? Authorizing Provider  cephALEXin (KEFLEX) 500 MG capsule Take 1 capsule (500 mg total) by mouth 2 (two) times daily. 05/22/17   Zadie Rhine, MD  gabapentin (NEURONTIN) 300 MG capsule Take 1 capsule (300 mg total) by mouth 3 (three) times daily. Take 2 capsules (600mg ) by mouth at bedtime Patient not taking: Reported on 03/06/2016 01/20/16   Oneta Rack, NP  hydrOXYzine (ATARAX/VISTARIL) 50 MG tablet Take 1 tablet (50 mg total) by mouth every 6 (six) hours as needed for anxiety (sleep). Patient not taking: Reported on 03/06/2016 01/20/16   Oneta Rack, NP  nicotine polacrilex (NICORETTE) 2 MG gum Take 1 each (2 mg total) by mouth as needed for smoking cessation. Patient not taking: Reported on 03/06/2016 01/20/16   Oneta Rack, NP  nitrofurantoin, macrocrystal-monohydrate, (MACROBID) 100 MG capsule Take 1 capsule (100 mg total) by mouth every 12 (twelve) hours. Patient not taking: Reported on 03/06/2016 01/20/16   Oneta Rack, NP  sertraline (ZOLOFT) 25 MG tablet Take 1 tablet (25 mg total) by mouth daily. Patient not taking: Reported on 03/06/2016 01/20/16   Oneta Rack, NP    Family History Family  History  Problem Relation Age of Onset  . Cancer Mother        breast w/metastasis to liver,lung,bone, brain  . Hypertension Sister   . Thyroid disease Sister   . Cancer Maternal Aunt        ovarian w/metastasis to colon    Social History Social History   Tobacco Use  . Smoking status: Current Every Day Smoker    Packs/day: 1.00    Years: 22.00    Pack years: 22.00    Types: Cigarettes  . Smokeless tobacco: Never Used  Substance Use Topics  . Alcohol use: Yes    Comment: socially  . Drug use: Yes    Types: Cocaine    Comment: used cocaine a few days ago.     Allergies   Shellfish allergy and Wellbutrin [bupropion  hcl]   Review of Systems Review of Systems  All other systems reviewed and are negative.    Physical Exam Updated Vital Signs BP (!) 156/97 (BP Location: Right Arm)   Pulse 85   Temp 98.7 F (37.1 C) (Temporal)   Resp 20   Ht 5\' 3"  (1.6 m)   Wt 63.5 kg (140 lb)   LMP 12/01/2017   SpO2 97%   BMI 24.80 kg/m   Physical Exam  Constitutional: She is oriented to person, place, and time. She appears well-developed and well-nourished. She appears distressed (She is uncomfortable).  HENT:  Head: Normocephalic and atraumatic.  Eyes: EOM are normal. Pupils are equal, round, and reactive to light. Right eye exhibits discharge (Clear, watery). Left eye exhibits no discharge.  Right eye injected, with mild photophobia during exam, and visible central corneal ulcer.  Right anterior eye is hazy.  External ocular muscles are intact.  Left eye appears normal.  Neck: Normal range of motion and phonation normal. Neck supple.  Cardiovascular: Normal rate.  Pulmonary/Chest: Effort normal.  Musculoskeletal: Normal range of motion.  Neurological: She is alert and oriented to person, place, and time. She exhibits normal muscle tone.  Skin: Skin is warm and dry.  Psychiatric: She has a normal mood and affect. Her behavior is normal. Judgment and thought content normal.  Nursing note and vitals reviewed.    ED Treatments / Results  Labs (all labs ordered are listed, but only abnormal results are displayed) Labs Reviewed - No data to display  EKG  EKG Interpretation None       Radiology No results found.  Procedures Procedures (including critical care time)  Medications Ordered in ED Medications - No data to display   Initial Impression / Assessment and Plan / ED Course  I have reviewed the triage vital signs and the nursing notes.  Pertinent labs & imaging results that were available during my care of the patient were reviewed by me and considered in my medical decision making  (see chart for details).  Clinical Course as of Dec 22 2213  Tue Dec 22, 2017  2212 We contacted [facility], and the patient had an ophthalmologic appointment yesterday, which she did not go to.  At this time the patient states that she did not remember that she had the appointment.  Case management has been consult to assist the patient with transport, after the patient schedules a follow-up ophthalmologic appointment  [EW]    Clinical Course User Index [EW] Mancel BaleWentz, Hannahmarie Asberry, MD     Patient Vitals for the past 24 hrs:  BP Temp Temp src Pulse Resp SpO2 Height Weight  12/22/17 1751 - - - - - - 5\' 3"  (1.6 m) 63.5 kg (140 lb)  12/22/17 1749 (!) 156/97 98.7 F (37.1 C) Temporal 85 20 97 % - -    10:05 PM Reevaluation with update and discussion. After initial assessment and treatment, an updated evaluation reveals findings and plan discussed with patient. 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  No indication for change in treatment at this time.  Patient is homeless and is having difficulty meeting her appointments, in [place].  Social services/case management has been contacted in order to hopefully get some assistance with her ongoing medical care.  Patient will need to contact the care manager to obtain assistance since she does not currently have a domicile or telephone.   Nursing Notes Reviewed/ Care Coordinated Applicable Imaging Reviewed Interpretation of Laboratory Data incorporated into ED treatment  The patient appears reasonably screened and/or stabilized for discharge and I doubt any other medical condition or other [facility] requiring further screening, evaluation, or treatment in the ED at this time prior to discharge.  Plan: Home Medications-DC with medications, tobramycin  ophthalmic, and brimonidine ophthalmic; Home Treatments-avoid illicit drugs; return here if the recommended treatment, does not improve the symptoms; Recommended follow up-follow-up with ophthalmology at [facility] as soon as possible.   ED Discharge Orders    None       Mancel Bale, MD 12/24/17 [phone]

## 2017-12-22 NOTE — ED Triage Notes (Signed)
Pt c/o of right eye pain worsening x 1 month.  [facility] and placed [facility].  Pt states she finished all course of meds but no relief.

## 2017-12-22 NOTE — Discharge Instructions (Signed)
It is very important to follow-up with the eye specialist at [facility] as soon as possible.  Call them tomorrow morning at [phone].  Tell them that you need an appointment with the GI doctor, Dr. Sharyn Drosshaunhi Van.  You can contact the Case Manager, [facility] to see if they can give you assistance with getting to the appointment. Call [facility] at [phone], to speak with the Case Manager.  Use the Brimonidine eye drops, 2 in the right eye every 8 hours. Use the Tobramycin eye drops, 2 in the right eye, every 2 hours.  Return to the emergency department if needed for problems.

## 2018-10-14 ENCOUNTER — Other Ambulatory Visit: Payer: Self-pay

## 2018-10-14 ENCOUNTER — Emergency Department (HOSPITAL_COMMUNITY)
Admission: EM | Admit: 2018-10-14 | Discharge: 2018-10-14 | Disposition: A | Discharge status: Home / Self Care | Payer: Self-pay | Attending: Emergency Medicine | Admitting: Emergency Medicine

## 2018-10-14 ENCOUNTER — Encounter (HOSPITAL_COMMUNITY): Payer: Self-pay | Admitting: Emergency Medicine

## 2018-10-14 DIAGNOSIS — B353 Tinea pedis: Secondary | ICD-10-CM | POA: Insufficient documentation

## 2018-10-14 DIAGNOSIS — L089 Local infection of the skin and subcutaneous tissue, unspecified: Secondary | ICD-10-CM | POA: Insufficient documentation

## 2018-10-14 DIAGNOSIS — Z79899 Other long term (current) drug therapy: Secondary | ICD-10-CM | POA: Insufficient documentation

## 2018-10-14 DIAGNOSIS — F17210 Nicotine dependence, cigarettes, uncomplicated: Secondary | ICD-10-CM | POA: Insufficient documentation

## 2018-10-14 DIAGNOSIS — L0889 Other specified local infections of the skin and subcutaneous tissue: Secondary | ICD-10-CM

## 2018-10-14 MED ORDER — CLINDAMYCIN HCL 150 MG PO CAPS: ORAL_CAPSULE | ORAL | 0 refills | Status: DC

## 2018-10-14 MED ORDER — DOXYCYCLINE HYCLATE 100 MG PO TABS
100.0000 mg | ORAL_TABLET | Freq: Once | ORAL | Status: AC
  Administered 2018-10-14: 100 mg via ORAL
  Filled 2018-10-14: qty 1

## 2018-10-14 MED ORDER — TERBINAFINE HCL 1 % EX CREA: 1.0000 "application " | TOPICAL_CREAM | Freq: Two times a day (BID) | CUTANEOUS | 0 refills | Status: DC

## 2018-10-14 MED ORDER — FLUCONAZOLE 100 MG PO TABS
200.0000 mg | ORAL_TABLET | Freq: Once | ORAL | Status: AC
  Administered 2018-10-14: 200 mg via ORAL
  Filled 2018-10-14: qty 2

## 2018-10-14 NOTE — ED Provider Notes (Signed)
[facility] EMERGENCY DEPARTMENT Provider Note   CSN: [id number] Arrival date & time: 10/14/18  1050     History   Chief Complaint Chief Complaint  Patient presents with  . Foot Injury    HPI Courtney Ponce is a 43 y.o. female.  Patient reports a rash on her feet and legs.  She reports open skin areas on the toes of the right foot.  She reports extreme itching in the palms and rash in the palms of her hands.  She says that approximately 1 year ago she sustained a spider bite and has been having intermittent rashes and edema since that time.  This particular episode started approximately for 5 days ago and has been getting progressively worse.  She has been trying some over-the-counter creams, but states these are not working well for her.  She is concerned because of the rash that seems to be spreading.  No fever reported.  No red streaks from the lower extremities or the upper extremities.  The history is provided by the patient.  Foot Injury  Location:  Foot Foot location:  R foot Associated symptoms: no back pain and no neck pain     Past Medical History:  Diagnosis Date  . Abnormal Pap smear   . Anxiety   . Chronic back pain   . Depression   . GERD (gastroesophageal reflux disease)   . Kidney infection   . Neuromuscular disorder [facility](HCC)     Patient Active Problem List   Diagnosis Date Noted  . Severe major depression without psychotic features (HCC) 01/15/2016  . Suicide attempt (HCC) 01/14/2016  . Cocaine use disorder, severe, dependence (HCC) 01/12/2016  . Cocaine abuse with cocaine-induced mood disorder (HCC) 01/12/2016  . Suicide attempt by drug ingestion (HCC) 01/12/2016  . QT prolongation 01/11/2016  . SSRI overdose 01/11/2016  . Overdose   . Wheezing   . UTI (lower urinary tract infection) 12/13/2015  . Cocaine abuse (HCC) 12/13/2015  . Muscle weakness (generalized) 12/07/2013  . Lack of coordination 12/07/2013  . Wrist fracture, right 11/24/2013  .  Right wrist fracture 10/17/2013  . Distal radius fracture 10/10/2013    Past Surgical History:  Procedure Laterality Date  . COLPOSCOPY  2002  . CYSTOSCOPY WITH URETHRAL DILATATION     age 89  . WISDOM TOOTH EXTRACTION       OB History    Gravida  1   Para  1   Term  1   Preterm      AB      Living  1     SAB      TAB      Ectopic      Multiple      Live Births               Home Medications    Prior to Admission medications   Medication Sig Start Date End Date Taking? Authorizing Provider  cephALEXin (KEFLEX) 500 MG capsule Take 1 capsule (500 mg total) by mouth 2 (two) times daily. 05/22/17   Zadie RhineWickline, Donald, MD  gabapentin (NEURONTIN) 300 MG capsule Take 1 capsule (300 mg total) by mouth 3 (three) times daily. Take 2 capsules (600mg ) by mouth at bedtime Patient not taking: Reported on 03/06/2016 01/20/16   Oneta RackLewis, Tanika N, NP  hydrOXYzine (ATARAX/VISTARIL) 50 MG tablet Take 1 tablet (50 mg total) by mouth every 6 (six) hours as needed for anxiety (sleep). Patient not taking: Reported on 03/06/2016 01/20/16  Oneta Rack, NP  nicotine polacrilex (NICORETTE) 2 MG gum Take 1 each (2 mg total) by mouth as needed for smoking cessation. Patient not taking: Reported on 03/06/2016 01/20/16   Oneta Rack, NP  nitrofurantoin, macrocrystal-monohydrate, (MACROBID) 100 MG capsule Take 1 capsule (100 mg total) by mouth every 12 (twelve) hours. Patient not taking: Reported on 03/06/2016 01/20/16   Oneta Rack, NP  sertraline (ZOLOFT) 25 MG tablet Take 1 tablet (25 mg total) by mouth daily. Patient not taking: Reported on 03/06/2016 01/20/16   Oneta Rack, NP    Family History Family History  Problem Relation Age of Onset  . Cancer Mother        breast w/metastasis to liver,lung,bone, brain  . Hypertension Sister   . Thyroid disease Sister   . Cancer Maternal Aunt        ovarian w/metastasis to colon    Social History Social History   Tobacco Use  .  Smoking status: Current Every Day Smoker    Packs/day: 1.00    Years: 22.00    Pack years: 22.00    Types: Cigarettes  . Smokeless tobacco: Never Used  Substance Use Topics  . Alcohol use: Yes    Comment: socially  . Drug use: Not Currently    Types: Cocaine     Allergies   Shellfish allergy and Wellbutrin [bupropion hcl]   Review of Systems Review of Systems  Constitutional: Negative for activity change.       All ROS Neg except as noted in HPI  HENT: Negative for nosebleeds.   Eyes: Negative for photophobia and discharge.  Respiratory: Negative for cough, shortness of breath and wheezing.   Cardiovascular: Negative for chest pain and palpitations.  Gastrointestinal: Negative for abdominal pain and blood in stool.  Genitourinary: Negative for dysuria, frequency and hematuria.  Musculoskeletal: Negative for arthralgias, back pain and neck pain.  Skin: Positive for rash and wound.       Wound to the toes of the feet  Neurological: Negative for dizziness, seizures and speech difficulty.  Psychiatric/Behavioral: Negative for confusion and hallucinations.     Physical Exam Updated Vital Signs BP 112/81   Pulse 71   Temp 97.6 F (36.4 C)   Resp 20   Ht 5\' 3"  (1.6 m)   Wt 70.3 kg   LMP 09/28/2018   SpO2 100%   BMI 27.46 kg/m   Physical Exam Vitals signs and nursing note reviewed.  Constitutional:      Appearance: She is well-developed. She is not toxic-appearing.  HENT:     Head: Normocephalic.     Right Ear: Tympanic membrane and external ear normal.     Left Ear: Tympanic membrane and external ear normal.  Eyes:     General: Lids are normal.     Pupils: Pupils are equal, round, and reactive to light.  Neck:     Musculoskeletal: Normal range of motion and neck supple.     Vascular: No carotid bruit.  Cardiovascular:     Rate and Rhythm: Normal rate and regular rhythm.     Pulses: Normal pulses.     Heart sounds: Normal heart sounds.  Pulmonary:      Effort: No respiratory distress.     Breath sounds: Wheezing and rhonchi present.  Abdominal:     General: Bowel sounds are normal.     Palpations: Abdomen is soft.     Tenderness: There is no abdominal tenderness. There is no guarding.  Musculoskeletal: Normal range of motion.  Lymphadenopathy:     Head:     Right side of head: No submandibular adenopathy.     Left side of head: No submandibular adenopathy.     Cervical: No cervical adenopathy.  Skin:    General: Skin is warm and dry.     Comments: Pt has a scaling rash of the right foot with open areas on the right 4th and fifth toes. Broken skin dry rash of the area between the 4th and fifth toes. Few lesion of the lower legs, both hands and forearm. Dry skin rash of the palms of the right and left hand.  Neurological:     Mental Status: She is alert and oriented to person, place, and time.     Cranial Nerves: No cranial nerve deficit.     Sensory: No sensory deficit.  Psychiatric:        Speech: Speech normal.      ED Treatments / Results  Labs (all labs ordered are listed, but only abnormal results are displayed) Labs Reviewed - No data to display  EKG None  Radiology No results found.  Procedures Procedures (including critical care time)  Medications Ordered in ED Medications - No data to display   Initial Impression / Assessment and Plan / ED Course  I have reviewed the triage vital signs and the nursing notes.  Pertinent labs & imaging results that were available during my care of the patient were reviewed by me and considered in my medical decision making (see chart for details).       Final Clinical Impressions(s) / ED Diagnoses MDM  Vital signs are within normal limits.  The examination favors tinea pedis with a secondary infection.  I have advised the patient to wash hands frequently.  Use a glove when she is handling her feet, particularly the open skin areas on her fourth and fifth toe on the  right.  She will use Lamisil cream.  And she will use clindamycin for the secondary infection.  I have given the patient resource information so that she may obtain a primary physician.  I have asked her to return to the emergency department if any emergent changes in condition, problems, or concerns.   Final diagnoses:  Tinea pedis of right foot  Secondary infection of skin    ED Discharge Orders    None       Ivery Quale, PA-C 10/14/18 1501    Bethann Berkshire, MD 10/14/18 1544

## 2018-10-14 NOTE — ED Notes (Signed)
Patient with excoriated skin to the R fourth toe and intraphalangeal space. She reports she had a spider bite approx 1 year ago and has had intermittent rash and edema since. Patient reports she has also had intermittent small vesicles to the palms of her hands and soles of her feet which itch then clear up spontaneously. Patient has small round erythematous plaques to her forearms which she reports have started recently.

## 2018-10-14 NOTE — Discharge Instructions (Addendum)
Please cleanse your feet with soap and water.  Dry your feet well.  Apply Lamisil antifungal cream.  Please use clindamycin 2 times daily with food until all taken.  Please see your primary physician or return to the emergency department if any signs of advancing infection

## 2018-10-14 NOTE — ED Triage Notes (Signed)
Pt states that she has a bit between her toes she thinks that it is in infected this happen over a year ago. She also is breaking out in a rash off and on.

## 2018-10-19 ENCOUNTER — Other Ambulatory Visit: Payer: Self-pay

## 2018-10-19 ENCOUNTER — Encounter (HOSPITAL_COMMUNITY): Payer: Self-pay

## 2018-10-19 ENCOUNTER — Emergency Department (HOSPITAL_COMMUNITY)
Admission: EM | Admit: 2018-10-19 | Discharge: 2018-10-19 | Disposition: A | Discharge status: Home / Self Care | Payer: Self-pay | Attending: Emergency Medicine | Admitting: Emergency Medicine

## 2018-10-19 DIAGNOSIS — R21 Rash and other nonspecific skin eruption: Secondary | ICD-10-CM | POA: Insufficient documentation

## 2018-10-19 DIAGNOSIS — F17210 Nicotine dependence, cigarettes, uncomplicated: Secondary | ICD-10-CM | POA: Insufficient documentation

## 2018-10-19 DIAGNOSIS — Z79899 Other long term (current) drug therapy: Secondary | ICD-10-CM | POA: Insufficient documentation

## 2018-10-19 LAB — BASIC METABOLIC PANEL
ANION GAP: 7 (ref 5–15)
BUN: 16 mg/dL (ref 6–20)
CHLORIDE: 105 mmol/L (ref 98–111)
CO2: 23 mmol/L (ref 22–32)
Calcium: 8.7 mg/dL — ABNORMAL LOW (ref 8.9–10.3)
Creatinine, Ser: 0.65 mg/dL (ref 0.44–1.00)
GFR calc non Af Amer: >60 mL/min (ref >60)
Glucose, Bld: 143 mg/dL — ABNORMAL HIGH (ref 70–99)
Potassium: 4 mmol/L (ref 3.5–5.1)
Sodium: 135 mmol/L (ref 135–145)

## 2018-10-19 LAB — CBC WITH DIFFERENTIAL/PLATELET
Abs Immature Granulocytes: 0.01 10*3/uL (ref 0.00–0.07)
BASOS ABS: 0 10*3/uL (ref 0.0–0.1)
Basophils Relative: 0 %
Eosinophils Absolute: 0.2 10*3/uL (ref 0.0–0.5)
Eosinophils Relative: 3 %
HCT: 40.3 % (ref 36.0–46.0)
HEMOGLOBIN: 13.2 g/dL (ref 12.0–15.0)
IMMATURE GRANULOCYTES: 0 %
LYMPHS PCT: 37 %
Lymphs Abs: 1.7 10*3/uL (ref 0.7–4.0)
MCH: 32.7 pg (ref 26.0–34.0)
MCHC: 32.8 g/dL (ref 30.0–36.0)
MCV: 99.8 fL (ref 80.0–100.0)
Monocytes Absolute: 0.4 10*3/uL (ref 0.1–1.0)
Monocytes Relative: 9 %
NEUTROS PCT: 51 %
NRBC: 0 % (ref 0.0–0.2)
Neutro Abs: 2.3 10*3/uL (ref 1.7–7.7)
Platelets: 122 10*3/uL — ABNORMAL LOW (ref 150–400)
RBC: 4.04 MIL/uL (ref 3.87–5.11)
RDW: 11.8 % (ref 11.5–15.5)
WBC: 4.7 10*3/uL (ref 4.0–10.5)

## 2018-10-19 MED ORDER — KETOCONAZOLE 2 % EX CREA: TOPICAL_CREAM | CUTANEOUS | 0 refills | Status: DC

## 2018-10-19 NOTE — ED Provider Notes (Signed)
[facility] EMERGENCY DEPARTMENT Provider Note   CSN: [id number] Arrival date & time: 10/19/18  0809     History   Chief Complaint Chief Complaint  Patient presents with  . Rash    HPI Courtney Ponce is a 43 y.o. female.  HPI   Courtney Ponce is a 43 y.o. female who presents to the Emergency Department complaining of recurrent painful, itching lesions to the dorsum webspaces of the right foot, soles of both feet and palms of both hands.  Symptoms have been present for 2 weeks and seem to be worsening in severity.  She also complains of itching inside her left nostril and the inside corner of her left eye that just began this morning upon waking. No visual changes. She had similar appearing lesions to the same area of her right foot approximately 1 year ago that seem to spontaneously improve, but did not completely resolve.  Around the same time that she had issue with her foot last year she developed an "infection" to her right eye that etiology is unclear.  She does state that it left her with some impaired vision of the right eye.  She is concerned her current symptoms may be spreading to her left eye.  She denies fever, chills, headaches, dizziness, visual changes, swelling or drainage from the rash.  She was seen here last week for same and given Clindamycin and told to use Lamasil cream which she states are not helping.     Past Medical History:  Diagnosis Date  . Abnormal Pap smear   . Anxiety   . Chronic back pain   . Depression   . GERD (gastroesophageal reflux disease)   . Kidney infection   . Neuromuscular disorder [facility])     Patient Active Problem List   Diagnosis Date Noted  . Severe major depression without psychotic features (HCC) 01/15/2016  . Suicide attempt (HCC) 01/14/2016  . Cocaine use disorder, severe, dependence (HCC) 01/12/2016  . Cocaine abuse with cocaine-induced mood disorder (HCC) 01/12/2016  . Suicide attempt by drug ingestion (HCC) 01/12/2016    . QT prolongation 01/11/2016  . SSRI overdose 01/11/2016  . Overdose   . Wheezing   . UTI (lower urinary tract infection) 12/13/2015  . Cocaine abuse (HCC) 12/13/2015  . Muscle weakness (generalized) 12/07/2013  . Lack of coordination 12/07/2013  . Wrist fracture, right 11/24/2013  . Right wrist fracture 10/17/2013  . Distal radius fracture 10/10/2013    Past Surgical History:  Procedure Laterality Date  . COLPOSCOPY  2002  . CYSTOSCOPY WITH URETHRAL DILATATION     age 3  . WISDOM TOOTH EXTRACTION       OB History    Gravida  1   Para  1   Term  1   Preterm      AB      Living  1     SAB      TAB      Ectopic      Multiple      Live Births               Home Medications    Prior to Admission medications   Medication Sig Start Date End Date Taking? Authorizing Provider  cephALEXin (KEFLEX) 500 MG capsule Take 1 capsule (500 mg total) by mouth 2 (two) times daily. 05/22/17   Zadie Rhine, MD  clindamycin (CLEOCIN) 150 MG capsule 2 po bid with food 10/14/18   Ivery Quale, PA-C  gabapentin (NEURONTIN) 300 MG capsule Take 1 capsule (300 mg total) by mouth 3 (three) times daily. Take 2 capsules (600mg ) by mouth at bedtime Patient not taking: Reported on 03/06/2016 01/20/16   Oneta Rack, NP  hydrOXYzine (ATARAX/VISTARIL) 50 MG tablet Take 1 tablet (50 mg total) by mouth every 6 (six) hours as needed for anxiety (sleep). Patient not taking: Reported on 03/06/2016 01/20/16   Oneta Rack, NP  nicotine polacrilex (NICORETTE) 2 MG gum Take 1 each (2 mg total) by mouth as needed for smoking cessation. Patient not taking: Reported on 03/06/2016 01/20/16   Oneta Rack, NP  nitrofurantoin, macrocrystal-monohydrate, (MACROBID) 100 MG capsule Take 1 capsule (100 mg total) by mouth every 12 (twelve) hours. Patient not taking: Reported on 03/06/2016 01/20/16   Oneta Rack, NP  sertraline (ZOLOFT) 25 MG tablet Take 1 tablet (25 mg total) by mouth daily. Patient  not taking: Reported on 03/06/2016 01/20/16   Oneta Rack, NP  terbinafine (LAMISIL) 1 % cream Apply 1 application topically 2 (two) times daily. 10/14/18   Ivery Quale, PA-C    Family History Family History  Problem Relation Age of Onset  . Cancer Mother        breast w/metastasis to liver,lung,bone, brain  . Hypertension Sister   . Thyroid disease Sister   . Cancer Maternal Aunt        ovarian w/metastasis to colon    Social History Social History   Tobacco Use  . Smoking status: Current Every Day Smoker    Packs/day: 1.00    Years: 22.00    Pack years: 22.00    Types: Cigarettes  . Smokeless tobacco: Never Used  Substance Use Topics  . Alcohol use: Yes    Comment: socially  . Drug use: Not Currently    Types: Cocaine     Allergies   Shellfish allergy and Wellbutrin [bupropion hcl]   Review of Systems Review of Systems  Constitutional: Negative for activity change, appetite change, chills and fever.  HENT: Negative for facial swelling, sore throat and trouble swallowing.   Eyes: Negative for pain and visual disturbance.  Respiratory: Negative for chest tightness, shortness of breath and wheezing.   Cardiovascular: Negative for chest pain.  Gastrointestinal: Negative for abdominal pain, nausea and vomiting.  Musculoskeletal: Negative for arthralgias, myalgias, neck pain and neck stiffness.  Skin: Positive for rash. Negative for wound.  Neurological: Negative for dizziness, weakness, numbness and headaches.     Physical Exam Updated Vital Signs BP (!) 94/57 (BP Location: Left Arm)   Pulse 66   Temp 98.1 F (36.7 C) (Oral)   Resp 15   LMP 09/28/2018   SpO2 99%   Physical Exam Vitals signs and nursing note reviewed.  Constitutional:      General: She is not in acute distress.    Appearance: She is well-developed.  HENT:     Head: Normocephalic and atraumatic.     Right Ear: Tympanic membrane and ear canal normal.     Left Ear: Tympanic membrane and  ear canal normal.     Nose: Nose normal.     Comments: No lesions to the nares.      Mouth/Throat:     Mouth: Mucous membranes are moist. No oral lesions.     Tongue: No lesions.     Pharynx: Oropharynx is clear. Uvula midline. No oropharyngeal exudate or posterior oropharyngeal erythema.  Eyes:     General: Lids are normal. Vision grossly intact.  Gaze aligned appropriately.     Extraocular Movements: Extraocular movements intact.     Conjunctiva/sclera: Conjunctivae normal.     Left eye: Left conjunctiva is not injected. No exudate.    Pupils: Pupils are equal, round, and reactive to light.     Comments: Eyelash underneath the medial lower lid.  No exudate   Neck:     Musculoskeletal: Normal range of motion and neck supple.  Cardiovascular:     Rate and Rhythm: Normal rate and regular rhythm.     Heart sounds: Normal heart sounds. No murmur.  Pulmonary:     Effort: Pulmonary effort is normal. No respiratory distress.     Breath sounds: Normal breath sounds.  Musculoskeletal:        General: No swelling or tenderness.  Lymphadenopathy:     Cervical: No cervical adenopathy.  Skin:    General: Skin is warm.     Capillary Refill: Capillary refill takes less than 2 seconds.     Findings: Erythema and rash present.     Comments: Erythematous, subacute appearing denuded vesicles, fissures and scaling to the dorsal right foot and webspaces of the toes and denuded vesicles to palms of bilateral hands and soles of both feet.  Neurological:     General: No focal deficit present.     Mental Status: She is alert.     Sensory: No sensory deficit.     Motor: No weakness or abnormal muscle tone.      ED Treatments / Results  Labs (all labs ordered are listed, but only abnormal results are displayed) Labs Reviewed  BASIC METABOLIC PANEL - Abnormal; Notable for the following components:      Result Value   Glucose, Bld 143 (*)    Calcium 8.7 (*)    All other components within normal  limits  CBC WITH DIFFERENTIAL/PLATELET - Abnormal; Notable for the following components:   Platelets 122 (*)    All other components within normal limits  RPR  HSV 1 ANTIBODY, IGG  HSV 2 ANTIBODY, IGG  HIV ANTIBODY (ROUTINE TESTING W REFLEX)    EKG None  Radiology No results found.  Procedures Procedures (including critical care time)  Medications Ordered in ED Medications - No data to display   Initial Impression / Assessment and Plan / ED Course  I have reviewed the triage vital signs and the nursing notes.  Pertinent labs & imaging results that were available during my care of the patient were reviewed by me and considered in my medical decision making (see chart for details).     Eyelash under the lower lid was removed by me with a moistened cotton swab w/o difficulty.  Eye otherwise nml appearing. No gross visual deficits.  0945 Pt also seen by Dr. Criss AlvineGoldston and care plan discussed.   Pt is well appearing, VSS, non-toxic.  Rash appears non-specific and somewhat chronic.  I feel this is most likely fungal although the differential would also include a viral etiology.  Cultures are pending.  I will start pt on an anti-fungal cream and provide referral info for dermatology.     Final Clinical Impressions(s) / ED Diagnoses   Final diagnoses:  Rash/skin eruption    ED Discharge Orders    None       Pauline Ausriplett, Jhalen Eley, Cordelia Poche-C 10/19/18 2029    Pricilla LovelessGoldston, Scott, MD 10/20/18 [phone]

## 2018-10-19 NOTE — Discharge Instructions (Addendum)
Apply the cream as directed to the affected areas and use for at least 2 weeks.  You will be notified of your culture results.  You may also follow-up with a dermatologist listed.

## 2018-10-19 NOTE — ED Triage Notes (Signed)
Pt reports rash to left r foot and hands x 2 months.  Reports was put on clindamycin last week but says is no better.  Pt concerned the rash has spread to her left nare and left eye because it is itching.

## 2018-10-20 ENCOUNTER — Encounter (HOSPITAL_COMMUNITY): Payer: Self-pay | Admitting: Emergency Medicine

## 2018-10-20 ENCOUNTER — Emergency Department (HOSPITAL_COMMUNITY)
Admission: EM | Admit: 2018-10-20 | Discharge: 2018-10-20 | Disposition: A | Discharge status: Home / Self Care | Payer: Self-pay | Attending: Emergency Medicine | Admitting: Emergency Medicine

## 2018-10-20 ENCOUNTER — Other Ambulatory Visit: Payer: Self-pay

## 2018-10-20 DIAGNOSIS — B009 Herpesviral infection, unspecified: Secondary | ICD-10-CM | POA: Insufficient documentation

## 2018-10-20 LAB — HSV 1 ANTIBODY, IGG: HSV 1 GLYCOPROTEIN G AB, IGG: 30.1 {index} — AB (ref 0.00–0.90)

## 2018-10-20 LAB — HIV ANTIBODY (ROUTINE TESTING W REFLEX): HIV Screen 4th Generation wRfx: NONREACTIVE

## 2018-10-20 LAB — RPR: RPR: NONREACTIVE

## 2018-10-20 LAB — HSV 2 ANTIBODY, IGG: HSV 2 Glycoprotein G Ab, IgG: 14.1 index — ABNORMAL HIGH (ref 0.00–0.90)

## 2018-10-20 MED ORDER — ACYCLOVIR 400 MG PO TABS: 400.0000 mg | ORAL_TABLET | Freq: Every day | ORAL | 0 refills | Status: DC

## 2018-10-20 MED ORDER — ACYCLOVIR 800 MG PO TABS
800.0000 mg | ORAL_TABLET | Freq: Once | ORAL | Status: AC
  Administered 2018-10-20: 800 mg via ORAL
  Filled 2018-10-20: qty 1

## 2018-10-20 NOTE — Discharge Instructions (Addendum)
Prescription for acyclovir which is an antiviral medication.  Follow-up with primary care clinic in [place].  Phone number given.

## 2018-10-20 NOTE — ED Triage Notes (Signed)
Patient called and told her lab came back as HSV I/II, was told to come to ED for IV antibiotics.

## 2018-10-21 NOTE — ED Provider Notes (Signed)
[facility] EMERGENCY DEPARTMENT Provider Note   CSN: [id number] Arrival date & time: 10/20/18  1626     History   Chief Complaint Chief Complaint  Patient presents with  . Abnormal Lab    HPI Kemora Sheran FavaS Crouse is a 43 y.o. female.  Persistent rash on feet and hands for several weeks.  Patient was seen in the ED yesterday and labs were drawn.  Antibody to HSV 1 and 2 were positive.  Patient was notified of these test results and return to the ED for specific treatment.  No fever, sweats, chills, cough, dysuria, stiff neck.     Past Medical History:  Diagnosis Date  . Abnormal Pap smear   . Anxiety   . Chronic back pain   . Depression   . GERD (gastroesophageal reflux disease)   . Kidney infection   . Neuromuscular disorder [facility](HCC)     Patient Active Problem List   Diagnosis Date Noted  . Severe major depression without psychotic features (HCC) 01/15/2016  . Suicide attempt (HCC) 01/14/2016  . Cocaine use disorder, severe, dependence (HCC) 01/12/2016  . Cocaine abuse with cocaine-induced mood disorder (HCC) 01/12/2016  . Suicide attempt by drug ingestion (HCC) 01/12/2016  . QT prolongation 01/11/2016  . SSRI overdose 01/11/2016  . Overdose   . Wheezing   . UTI (lower urinary tract infection) 12/13/2015  . Cocaine abuse (HCC) 12/13/2015  . Muscle weakness (generalized) 12/07/2013  . Lack of coordination 12/07/2013  . Wrist fracture, right 11/24/2013  . Right wrist fracture 10/17/2013  . Distal radius fracture 10/10/2013    Past Surgical History:  Procedure Laterality Date  . COLPOSCOPY  2002  . CYSTOSCOPY WITH URETHRAL DILATATION     age 89  . WISDOM TOOTH EXTRACTION       OB History    Gravida  1   Para  1   Term  1   Preterm      AB      Living  1     SAB      TAB      Ectopic      Multiple      Live Births               Home Medications    Prior to Admission medications   Medication Sig Start Date End Date Taking? Authorizing  Provider  acyclovir (ZOVIRAX) 400 MG tablet Take 1 tablet (400 mg total) by mouth 5 (five) times daily. 10/20/18   Donnetta Hutchingook, Fitz Matsuo, MD  cephALEXin (KEFLEX) 500 MG capsule Take 1 capsule (500 mg total) by mouth 2 (two) times daily. 05/22/17   Zadie RhineWickline, Donald, MD  clindamycin (CLEOCIN) 150 MG capsule 2 po bid with food 10/14/18   Ivery QualeBryant, Hobson, PA-C  gabapentin (NEURONTIN) 300 MG capsule Take 1 capsule (300 mg total) by mouth 3 (three) times daily. Take 2 capsules (600mg ) by mouth at bedtime Patient not taking: Reported on 03/06/2016 01/20/16   Oneta RackLewis, Tanika N, NP  hydrOXYzine (ATARAX/VISTARIL) 50 MG tablet Take 1 tablet (50 mg total) by mouth every 6 (six) hours as needed for anxiety (sleep). Patient not taking: Reported on 03/06/2016 01/20/16   Oneta RackLewis, Tanika N, NP  ketoconazole (NIZORAL) 2 % cream Apply once a day to the affected areas 10/19/18   Triplett, Tammy, PA-C  nicotine polacrilex (NICORETTE) 2 MG gum Take 1 each (2 mg total) by mouth as needed for smoking cessation. Patient not taking: Reported on 03/06/2016 01/20/16   Oneta RackLewis, Tanika N,  NP  nitrofurantoin, macrocrystal-monohydrate, (MACROBID) 100 MG capsule Take 1 capsule (100 mg total) by mouth every 12 (twelve) hours. Patient not taking: Reported on 03/06/2016 01/20/16   Oneta RackLewis, Tanika N, NP  sertraline (ZOLOFT) 25 MG tablet Take 1 tablet (25 mg total) by mouth daily. Patient not taking: Reported on 03/06/2016 01/20/16   Oneta RackLewis, Tanika N, NP  terbinafine (LAMISIL) 1 % cream Apply 1 application topically 2 (two) times daily. 10/14/18   Ivery QualeBryant, Hobson, PA-C    Family History Family History  Problem Relation Age of Onset  . Cancer Mother        breast w/metastasis to liver,lung,bone, brain  . Hypertension Sister   . Thyroid disease Sister   . Cancer Maternal Aunt        ovarian w/metastasis to colon    Social History Social History   Tobacco Use  . Smoking status: Current Every Day Smoker    Packs/day: 1.00    Years: 22.00    Pack years: 22.00     Types: Cigarettes  . Smokeless tobacco: Never Used  Substance Use Topics  . Alcohol use: Yes    Comment: socially  . Drug use: Not Currently    Types: Cocaine     Allergies   Shellfish allergy and Wellbutrin [bupropion hcl]   Review of Systems Review of Systems  All other systems reviewed and are negative.    Physical Exam Updated Vital Signs BP (!) 138/97   Pulse 83   Temp 98.4 F (36.9 C) (Oral)   Resp 16   Ht 5\' 3"  (1.6 m)   Wt 72.1 kg   LMP 09/28/2018   SpO2 99%   BMI 28.17 kg/m   Physical Exam Vitals signs and nursing note reviewed.  Constitutional:      Appearance: She is well-developed.     Comments: nad  HENT:     Head: Normocephalic and atraumatic.  Eyes:     Conjunctiva/sclera: Conjunctivae normal.  Neck:     Musculoskeletal: Neck supple.  Cardiovascular:     Rate and Rhythm: Normal rate and regular rhythm.  Pulmonary:     Effort: Pulmonary effort is normal.     Breath sounds: Normal breath sounds.  Abdominal:     General: Bowel sounds are normal.     Palpations: Abdomen is soft.  Musculoskeletal: Normal range of motion.  Skin:    Comments: Feet, hands, medial aspect of left eye (soft tissue): Vesicular, papular rash and above location.  No eye involvement.  Neurological:     Mental Status: She is alert and oriented to person, place, and time.  Psychiatric:        Behavior: Behavior normal.      ED Treatments / Results  Labs (all labs ordered are listed, but only abnormal results are displayed) Labs Reviewed - No data to display  EKG None  Radiology No results found.  Procedures Procedures (including critical care time)  Medications Ordered in ED Medications  acyclovir (ZOVIRAX) tablet 800 mg (800 mg Oral Given 10/20/18 2043)     Initial Impression / Assessment and Plan / ED Course  I have reviewed the triage vital signs and the nursing notes.  Pertinent labs & imaging results that were available during my care of the  patient were reviewed by me and considered in my medical decision making (see chart for details).     Rash consistent with herpes simplex virus.  Patient is nontoxic-appearing.  Will start acyclovir.  Discussed diagnosis with patient.  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

## 2018-10-25 ENCOUNTER — Encounter (HOSPITAL_COMMUNITY): Payer: Self-pay | Admitting: Emergency Medicine

## 2018-10-25 ENCOUNTER — Emergency Department (HOSPITAL_COMMUNITY)
Admission: EM | Admit: 2018-10-25 | Discharge: 2018-10-25 | Disposition: A | Discharge status: Home / Self Care | Payer: Self-pay | Attending: Emergency Medicine | Admitting: Emergency Medicine

## 2018-10-25 ENCOUNTER — Other Ambulatory Visit: Payer: Self-pay

## 2018-10-25 DIAGNOSIS — R21 Rash and other nonspecific skin eruption: Secondary | ICD-10-CM | POA: Insufficient documentation

## 2018-10-25 DIAGNOSIS — Z79899 Other long term (current) drug therapy: Secondary | ICD-10-CM | POA: Insufficient documentation

## 2018-10-25 DIAGNOSIS — F17210 Nicotine dependence, cigarettes, uncomplicated: Secondary | ICD-10-CM | POA: Insufficient documentation

## 2018-10-25 MED ORDER — CEPHALEXIN 500 MG PO CAPS
500.0000 mg | ORAL_CAPSULE | Freq: Once | ORAL | Status: AC
  Administered 2018-10-25: 500 mg via ORAL
  Filled 2018-10-25: qty 1

## 2018-10-25 MED ORDER — CEPHALEXIN 500 MG PO CAPS: 500.0000 mg | ORAL_CAPSULE | Freq: Four times a day (QID) | ORAL | 0 refills | Status: DC

## 2018-10-25 MED ORDER — CLOTRIMAZOLE-BETAMETHASONE 1-0.05 % EX CREA
TOPICAL_CREAM | Freq: Two times a day (BID) | CUTANEOUS | Status: DC
  Administered 2018-10-25: 12:00:00 via TOPICAL
  Filled 2018-10-25: qty 15

## 2018-10-25 NOTE — ED Triage Notes (Signed)
Rash to both feet and hands.  Have been treated 3 times with 3 different antibiotics, antifungal and antiviral, without relief.  c/o itching, burning and redness to hands and feet.  Pt says she had a spider bite about one year ago on four toe on right foot.  Says toe never really healed for one year ago.  Rates pain 6/10.

## 2018-10-25 NOTE — Discharge Instructions (Addendum)
As discussed you need to continue using your acyclovir.  I have added Keflex to help with any early bacterial infection that may be developing.  You may use the Lotrisone cream which is a combination of an antifungal but there is also a steroid component which will help with the itching.  Apply this cream twice daily to your areas of rash, small quantity rubbed incompletely.  As discussed you do need to see a dermatologist for further management of this rash.  I also recommend following up with our local [facility] which can help you with referrals and other community resources and also can offer assistance with your medications as needed.   [facility]  [address] [place], [place] [address] [phone]  Services [facility] offers a variety of basic health services.  Services include but are not limited to: Blood pressure checks  Heart rate checks  Blood sugar checks  Urine analysis  Rapid strep tests  Pregnancy tests.  Health education and referrals  People needing more complex services will be directed to a physician online. Using these virtual visits, doctors can evaluate and prescribe medicine and treatments. There will be no medication on-site, though [facility] will help patients fill their prescriptions at little to no cost.   For More information please go to: [email or web address]

## 2018-10-25 NOTE — ED Provider Notes (Signed)
[facility] EMERGENCY DEPARTMENT Provider Note   CSN: [id number] Arrival date & time: 10/25/18  6967     History   Chief Complaint Chief Complaint  Patient presents with  . Rash    HPI Courtney Ponce is a 43 y.o. female returning for further evaluation of her bilateral hand and feet rash.  She was originally diagnosed here this month with a fungal foot infection and was placed on Lamisil cream, however her rash continued to spread and then developed blisters.  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  She reports continued pain but also more deep itching which is not satisfied by scratching, also has new areas of blistering and areas of cracked skin especially between her toes.  She has been using calamine lotion as the lesions were moist, now her skin is very dry and more itchy.  She denies fevers or chills or any other constitutional complaints.  She has been referred to dermatology but due to lack of insurance has been unable to obtain this follow-up care.  She notes new swelling of her right fourth and fifth toes and is concerned about possible bacterial infection.  There is been no drainage.  HPI  Past Medical History:  Diagnosis Date  . Abnormal Pap smear   . Anxiety   . Chronic back pain   . Depression   . GERD (gastroesophageal reflux disease)   . Kidney infection   . Neuromuscular disorder [facility])     Patient Active Problem List   Diagnosis Date Noted  . Severe major depression without psychotic features (HCC) 01/15/2016  . Suicide attempt (HCC) 01/14/2016  . Cocaine use disorder, severe, dependence (HCC) 01/12/2016  . Cocaine abuse with cocaine-induced mood disorder (HCC) 01/12/2016  . Suicide attempt by drug ingestion (HCC) 01/12/2016  . QT prolongation 01/11/2016  . SSRI overdose 01/11/2016  . Overdose   . Wheezing   . UTI (lower urinary tract infection) 12/13/2015  .  Cocaine abuse (HCC) 12/13/2015  . Muscle weakness (generalized) 12/07/2013  . Lack of coordination 12/07/2013  . Wrist fracture, right 11/24/2013  . Right wrist fracture 10/17/2013  . Distal radius fracture 10/10/2013    Past Surgical History:  Procedure Laterality Date  . COLPOSCOPY  2002  . CYSTOSCOPY WITH URETHRAL DILATATION     age 71  . WISDOM TOOTH EXTRACTION       OB History    Gravida  1   Para  1   Term  1   Preterm      AB      Living  1     SAB      TAB      Ectopic      Multiple      Live Births               Home Medications    Prior to Admission medications   Medication Sig Start Date End Date Taking? Authorizing Provider  acyclovir (ZOVIRAX) 400 MG tablet Take 1 tablet (400 mg total) by mouth 5 (five) times daily. 10/20/18   Donnetta Hutching, MD  cephALEXin (KEFLEX) 500 MG capsule Take 1 capsule (500 mg total) by mouth 4 (four) times daily. 10/25/18   Burgess Amor, PA-C  clindamycin (CLEOCIN) 150 MG capsule 2 po bid with food 10/14/18   Ivery Quale, PA-C  gabapentin (NEURONTIN) 300 MG capsule Take 1 capsule (  300 mg total) by mouth 3 (three) times daily. Take 2 capsules (600mg ) by mouth at bedtime Patient not taking: Reported on 03/06/2016 01/20/16   Oneta RackLewis, Tanika N, NP  hydrOXYzine (ATARAX/VISTARIL) 50 MG tablet Take 1 tablet (50 mg total) by mouth every 6 (six) hours as needed for anxiety (sleep). Patient not taking: Reported on 03/06/2016 01/20/16   Oneta RackLewis, Tanika N, NP  ketoconazole (NIZORAL) 2 % cream Apply once a day to the affected areas 10/19/18   Triplett, Tammy, PA-C  nicotine polacrilex (NICORETTE) 2 MG gum Take 1 each (2 mg total) by mouth as needed for smoking cessation. Patient not taking: Reported on 03/06/2016 01/20/16   Oneta RackLewis, Tanika N, NP  nitrofurantoin, macrocrystal-monohydrate, (MACROBID) 100 MG capsule Take 1 capsule (100 mg total) by mouth every 12 (twelve) hours. Patient not taking: Reported on 03/06/2016 01/20/16   Oneta RackLewis, Tanika N, NP    sertraline (ZOLOFT) 25 MG tablet Take 1 tablet (25 mg total) by mouth daily. Patient not taking: Reported on 03/06/2016 01/20/16   Oneta RackLewis, Tanika N, NP  terbinafine (LAMISIL) 1 % cream Apply 1 application topically 2 (two) times daily. 10/14/18   Ivery QualeBryant, Hobson, PA-C    Family History Family History  Problem Relation Age of Onset  . Cancer Mother        breast w/metastasis to liver,lung,bone, brain  . Hypertension Sister   . Thyroid disease Sister   . Cancer Maternal Aunt        ovarian w/metastasis to colon    Social History Social History   Tobacco Use  . Smoking status: Current Every Day Smoker    Packs/day: 1.00    Years: 22.00    Pack years: 22.00    Types: Cigarettes  . Smokeless tobacco: Never Used  Substance Use Topics  . Alcohol use: Yes    Comment: socially  . Drug use: Not Currently    Types: Cocaine     Allergies   Shellfish allergy and Wellbutrin [bupropion hcl]   Review of Systems Review of Systems  Constitutional: Negative for chills and fever.  Respiratory: Negative for shortness of breath and wheezing.   Skin: Positive for rash and wound.  Neurological: Negative for numbness.     Physical Exam Updated Vital Signs BP 102/90 (BP Location: Right Arm)   Pulse 71   Temp 97.9 F (36.6 C) (Oral)   Resp 16   Ht 5\' 3"  (1.6 m)   Wt 72.6 kg   LMP 09/27/2018 (Exact Date)   SpO2 100%   BMI 28.34 kg/m   Physical Exam Constitutional:      General: She is not in acute distress.    Appearance: She is well-developed.  HENT:     Head: Normocephalic.  Neck:     Musculoskeletal: Neck supple.  Cardiovascular:     Rate and Rhythm: Normal rate.  Pulmonary:     Effort: Pulmonary effort is normal.     Breath sounds: No wheezing.  Musculoskeletal: Normal range of motion.  Skin:    Findings: Lesion and rash present. No erythema.     Comments: Patient has a moderate dry appearing rash along her distal right foot and in between multiple toes with fissuring  between several of the toes.  She also has multiple denuded vesicles which are dry without drainage.  There is no surrounding erythema.  Bilateral palms also have similar pattern, she has several new intact clear fluid-filled vesicles along several fingers.      ED Treatments / Results  Labs (all labs ordered are listed, but only abnormal results are displayed) Labs Reviewed - No data to display  EKG None  Radiology No results found.  Procedures Procedures (including critical care time)  Medications Ordered in ED Medications  clotrimazole-betamethasone (LOTRISONE) cream (has no administration in time range)  cephALEXin (KEFLEX) capsule 500 mg (has no administration in time range)     Initial Impression / Assessment and Plan / ED Course  I have reviewed the triage vital signs and the nursing notes.  Pertinent labs & imaging results that were available during my care of the patient were reviewed by me and considered in my medical decision making (see chart for details).     Patient with ongoing rash which has the appearance of more than 1 process including a fungal skin infection, superimposed on vesicular rash with positive HSV-1 and 2 per labs completed at her last visit here.  Patient advised to continue with the acyclovir, I have added Lotrisone to treat fungal infection, but also to help with symptom relief.  There may be early bacterial infection superimposed especially in her right fourth and fifth toes, there is no abscess at this location.  She was placed on Keflex for this condition.  Discussed follow-up care including establishing at the [facility] to help not only with her medications but for other help with community resources including dermatology consult as needed.  Patient agrees to proceed with this plan.  Final Clinical Impressions(s) / ED Diagnoses   Final diagnoses:  Rash and nonspecific skin eruption    ED Discharge Orders         Ordered     cephALEXin (KEFLEX) 500 MG capsule  4 times daily     10/25/18 1114           Burgess Amor, Cordelia Poche 10/25/18 1116    Vanetta Mulders, MD 10/26/18 [phone]

## 2018-11-19 ENCOUNTER — Other Ambulatory Visit: Payer: Self-pay

## 2018-11-19 ENCOUNTER — Encounter (HOSPITAL_COMMUNITY): Payer: Self-pay | Admitting: Emergency Medicine

## 2018-11-19 ENCOUNTER — Emergency Department (HOSPITAL_COMMUNITY)
Admission: EM | Admit: 2018-11-19 | Discharge: 2018-11-19 | Disposition: A | Discharge status: Home / Self Care | Payer: Self-pay | Attending: Emergency Medicine | Admitting: Emergency Medicine

## 2018-11-19 DIAGNOSIS — R0981 Nasal congestion: Secondary | ICD-10-CM | POA: Insufficient documentation

## 2018-11-19 DIAGNOSIS — R05 Cough: Secondary | ICD-10-CM | POA: Insufficient documentation

## 2018-11-19 DIAGNOSIS — F17210 Nicotine dependence, cigarettes, uncomplicated: Secondary | ICD-10-CM | POA: Insufficient documentation

## 2018-11-19 DIAGNOSIS — J029 Acute pharyngitis, unspecified: Secondary | ICD-10-CM | POA: Insufficient documentation

## 2018-11-19 DIAGNOSIS — Z79899 Other long term (current) drug therapy: Secondary | ICD-10-CM | POA: Insufficient documentation

## 2018-11-19 MED ORDER — MAGIC MOUTHWASH W/LIDOCAINE: 5.0000 mL | Freq: Three times a day (TID) | ORAL | 0 refills | Status: DC | PRN

## 2018-11-19 NOTE — Discharge Instructions (Addendum)
Tylenol if needed for body aches or fever.  Follow-up with your primary care provider or return to the ER for any worsening symptoms.

## 2018-11-19 NOTE — ED Notes (Signed)
Strep cancelled by TT

## 2018-11-19 NOTE — ED Triage Notes (Signed)
Sore , itchy throat since Wednesday

## 2018-11-20 NOTE — ED Provider Notes (Signed)
[facility] EMERGENCY DEPARTMENT Provider Note   CSN: [id number] Arrival date & time: 11/19/18  1142     History   Chief Complaint Chief Complaint  Patient presents with  . Sore Throat    HPI Courtney Ponce is a 43 y.o. female.  HPI   Courtney Ponce is a 43 y.o. female who presents to the Emergency Department complaining of sore throat for 2 days.  She describes her throat symptoms as "slightly sore and itchy." symptoms are associated with swallowing.  She also reports some nasal congestion and occasional cough that is mostly nonproductive.  She reports a history of frequent strep throat infections and she is concerned that she may have another.  She denies difficulty swallowing, change in her voice,  fever, chills, rash, abdominal pain, body aches or recent exposures to influenza. She has not taken any medication for symptomatic relief.   Past Medical History:  Diagnosis Date  . Abnormal Pap smear   . Anxiety   . Chronic back pain   . Depression   . GERD (gastroesophageal reflux disease)   . Kidney infection   . Neuromuscular disorder [facility])     Patient Active Problem List   Diagnosis Date Noted  . Severe major depression without psychotic features (HCC) 01/15/2016  . Suicide attempt (HCC) 01/14/2016  . Cocaine use disorder, severe, dependence (HCC) 01/12/2016  . Cocaine abuse with cocaine-induced mood disorder (HCC) 01/12/2016  . Suicide attempt by drug ingestion (HCC) 01/12/2016  . QT prolongation 01/11/2016  . SSRI overdose 01/11/2016  . Overdose   . Wheezing   . UTI (lower urinary tract infection) 12/13/2015  . Cocaine abuse (HCC) 12/13/2015  . Muscle weakness (generalized) 12/07/2013  . Lack of coordination 12/07/2013  . Wrist fracture, right 11/24/2013  . Right wrist fracture 10/17/2013  . Distal radius fracture 10/10/2013    Past Surgical History:  Procedure Laterality Date  . COLPOSCOPY  2002  . CYSTOSCOPY WITH URETHRAL DILATATION     age 24  .  WISDOM TOOTH EXTRACTION       OB History    Gravida  1   Para  1   Term  1   Preterm      AB      Living  1     SAB      TAB      Ectopic      Multiple      Live Births               Home Medications    Prior to Admission medications   Medication Sig Start Date End Date Taking? Authorizing Provider  acyclovir (ZOVIRAX) 400 MG tablet Take 1 tablet (400 mg total) by mouth 5 (five) times daily. 10/20/18   Donnetta Hutching, MD  cephALEXin (KEFLEX) 500 MG capsule Take 1 capsule (500 mg total) by mouth 4 (four) times daily. 10/25/18   Burgess Amor, PA-C  clindamycin (CLEOCIN) 150 MG capsule 2 po bid with food 10/14/18   Ivery Quale, PA-C  gabapentin (NEURONTIN) 300 MG capsule Take 1 capsule (300 mg total) by mouth 3 (three) times daily. Take 2 capsules (600mg ) by mouth at bedtime Patient not taking: Reported on 03/06/2016 01/20/16   Oneta Rack, NP  hydrOXYzine (ATARAX/VISTARIL) 50 MG tablet Take 1 tablet (50 mg total) by mouth every 6 (six) hours as needed for anxiety (sleep). Patient not taking: Reported on 03/06/2016 01/20/16   Oneta Rack, NP  ketoconazole (NIZORAL) 2 %  cream Apply once a day to the affected areas 10/19/18   Tamila Gaulin, PA-C  magic mouthwash w/lidocaine SOLN Take 5 mLs by mouth 3 (three) times daily as needed for mouth pain. Swish and spit, do not swallow 11/19/18   Jaimy Kliethermes, PA-C  nicotine polacrilex (NICORETTE) 2 MG gum Take 1 each (2 mg total) by mouth as needed for smoking cessation. Patient not taking: Reported on 03/06/2016 01/20/16   Oneta Rack, NP  nitrofurantoin, macrocrystal-monohydrate, (MACROBID) 100 MG capsule Take 1 capsule (100 mg total) by mouth every 12 (twelve) hours. Patient not taking: Reported on 03/06/2016 01/20/16   Oneta Rack, NP  sertraline (ZOLOFT) 25 MG tablet Take 1 tablet (25 mg total) by mouth daily. Patient not taking: Reported on 03/06/2016 01/20/16   Oneta Rack, NP  terbinafine (LAMISIL) 1 % cream Apply 1  application topically 2 (two) times daily. 10/14/18   Ivery Quale, PA-C    Family History Family History  Problem Relation Age of Onset  . Cancer Mother        breast w/metastasis to liver,lung,bone, brain  . Hypertension Sister   . Thyroid disease Sister   . Cancer Maternal Aunt        ovarian w/metastasis to colon    Social History Social History   Tobacco Use  . Smoking status: Current Every Day Smoker    Packs/day: 1.00    Years: 22.00    Pack years: 22.00    Types: Cigarettes  . Smokeless tobacco: Never Used  Substance Use Topics  . Alcohol use: Yes    Comment: socially  . Drug use: Not Currently    Types: Cocaine     Allergies   Shellfish allergy and Wellbutrin [bupropion hcl]   Review of Systems Review of Systems  Constitutional: Negative for activity change, appetite change, chills and fever.  HENT: Positive for congestion and sore throat. Negative for ear pain, facial swelling, trouble swallowing and voice change.   Eyes: Negative for pain and visual disturbance.  Respiratory: Negative for cough and shortness of breath.   Cardiovascular: Negative for chest pain.  Gastrointestinal: Negative for abdominal pain, diarrhea, nausea and vomiting.  Musculoskeletal: Negative for myalgias, neck pain and neck stiffness.  Skin: Negative for color change and rash.  Neurological: Negative for dizziness, facial asymmetry, speech difficulty, numbness and headaches.  Hematological: Negative for adenopathy.     Physical Exam Updated Vital Signs BP (!) 143/96 (BP Location: Right Arm)   Pulse 63   Temp 97.9 F (36.6 C) (Temporal)   Resp 18   Wt 74.8 kg   LMP 11/04/2018   SpO2 100%   BMI 29.23 kg/m   Physical Exam Vitals signs and nursing note reviewed.  Constitutional:      General: She is not in acute distress.    Appearance: Normal appearance. She is well-developed. She is not ill-appearing.  HENT:     Head:     Jaw: No trismus.     Right Ear: Tympanic  membrane and ear canal normal.     Left Ear: Tympanic membrane and ear canal normal.     Nose: Congestion present. No rhinorrhea.     Mouth/Throat:     Mouth: Mucous membranes are moist.     Pharynx: Oropharynx is clear. Uvula midline. Posterior oropharyngeal erythema present. No oropharyngeal exudate or uvula swelling.     Tonsils: No tonsillar abscesses.     Comments: No erythema or edema of the oropharynx.  No exudates.  Uvula is mid line and non edematous Neck:     Musculoskeletal: Normal range of motion and neck supple.  Cardiovascular:     Rate and Rhythm: Normal rate and regular rhythm.     Heart sounds: Normal heart sounds.  Pulmonary:     Effort: Pulmonary effort is normal.     Breath sounds: Normal breath sounds.  Abdominal:     Palpations: Abdomen is soft. There is no splenomegaly.     Tenderness: There is no abdominal tenderness.  Musculoskeletal: Normal range of motion.  Lymphadenopathy:     Cervical: No cervical adenopathy.  Skin:    General: Skin is warm and dry.     Findings: No rash.  Neurological:     Mental Status: She is alert. Mental status is at baseline.     Motor: No abnormal muscle tone.     Coordination: Coordination normal.      ED Treatments / Results  Labs (all labs ordered are listed, but only abnormal results are displayed) Labs Reviewed  GROUP A STREP BY PCR    EKG None  Radiology No results found.  Procedures Procedures (including critical care time)  Medications Ordered in ED Medications - No data to display   Initial Impression / Assessment and Plan / ED Course  I have reviewed the triage vital signs and the nursing notes.  Pertinent labs & imaging results that were available during my care of the patient were reviewed by me and considered in my medical decision making (see chart for details).     Pt is well appearing with a benign appearing throat exam.  No concerning sx's for peritonsillar or retropharyngeal abscess.   Symptoms are felt to be viral and likely related to postnasal drip.  Patient reassured, she agrees to prescription for Magic mouthwash and Tylenol or ibuprofen if needed for pain.  Return precautions were discussed.  Final Clinical Impressions(s) / ED Diagnoses   Final diagnoses:  Sore throat    ED Discharge Orders         Ordered    magic mouthwash w/lidocaine SOLN  3 times daily PRN     11/19/18 1212           Pauline Ausriplett, Rowynn Mcweeney, PA-C 11/20/18 [phone]    Blane OharaZavitz, Joshua, MD 11/22/18 2324

## 2020-02-18 ENCOUNTER — Other Ambulatory Visit: Payer: Self-pay

## 2020-02-18 ENCOUNTER — Emergency Department (HOSPITAL_COMMUNITY): Payer: Self-pay

## 2020-02-18 ENCOUNTER — Encounter (HOSPITAL_COMMUNITY): Payer: Self-pay | Admitting: *Deleted

## 2020-02-18 ENCOUNTER — Emergency Department (HOSPITAL_COMMUNITY)
Admission: EM | Admit: 2020-02-18 | Discharge: 2020-02-18 | Disposition: A | Discharge status: Home / Self Care | Payer: Self-pay | Attending: Emergency Medicine | Admitting: Emergency Medicine

## 2020-02-18 DIAGNOSIS — Z20822 Contact with and (suspected) exposure to covid-19: Secondary | ICD-10-CM | POA: Insufficient documentation

## 2020-02-18 DIAGNOSIS — J189 Pneumonia, unspecified organism: Secondary | ICD-10-CM | POA: Insufficient documentation

## 2020-02-18 DIAGNOSIS — F17210 Nicotine dependence, cigarettes, uncomplicated: Secondary | ICD-10-CM | POA: Insufficient documentation

## 2020-02-18 DIAGNOSIS — Z79899 Other long term (current) drug therapy: Secondary | ICD-10-CM | POA: Insufficient documentation

## 2020-02-18 LAB — BASIC METABOLIC PANEL
Anion gap: 10 (ref 5–15)
BUN: 14 mg/dL (ref 6–20)
CO2: 20 mmol/L — ABNORMAL LOW (ref 22–32)
Calcium: 8.8 mg/dL — ABNORMAL LOW (ref 8.9–10.3)
Chloride: 104 mmol/L (ref 98–111)
Creatinine, Ser: 0.76 mg/dL (ref 0.44–1.00)
GFR calc Af Amer: >60 mL/min (ref >60)
GFR calc non Af Amer: >60 mL/min (ref >60)
Glucose, Bld: 133 mg/dL — ABNORMAL HIGH (ref 70–99)
Potassium: 3.5 mmol/L (ref 3.5–5.1)
Sodium: 134 mmol/L — ABNORMAL LOW (ref 135–145)

## 2020-02-18 LAB — CBC WITH DIFFERENTIAL/PLATELET
Abs Immature Granulocytes: 0.06 10*3/uL (ref 0.00–0.07)
Basophils Absolute: 0 10*3/uL (ref 0.0–0.1)
Basophils Relative: 0 %
Eosinophils Absolute: 0 10*3/uL (ref 0.0–0.5)
Eosinophils Relative: 0 %
HCT: 47.3 % — ABNORMAL HIGH (ref 36.0–46.0)
Hemoglobin: 16.2 g/dL — ABNORMAL HIGH (ref 12.0–15.0)
Immature Granulocytes: 1 %
Lymphocytes Relative: 11 %
Lymphs Abs: 1.4 10*3/uL (ref 0.7–4.0)
MCH: 33.5 pg (ref 26.0–34.0)
MCHC: 34.2 g/dL (ref 30.0–36.0)
MCV: 97.9 fL (ref 80.0–100.0)
Monocytes Absolute: 0.7 10*3/uL (ref 0.1–1.0)
Monocytes Relative: 6 %
Neutro Abs: 10.8 10*3/uL — ABNORMAL HIGH (ref 1.7–7.7)
Neutrophils Relative %: 82 %
Platelets: 125 10*3/uL — ABNORMAL LOW (ref 150–400)
RBC: 4.83 MIL/uL (ref 3.87–5.11)
RDW: 11.7 % (ref 11.5–15.5)
WBC: 13 10*3/uL — ABNORMAL HIGH (ref 4.0–10.5)
nRBC: 0 % (ref 0.0–0.2)

## 2020-02-18 LAB — SARS CORONAVIRUS 2 BY RT PCR (HOSPITAL ORDER, PERFORMED IN ~~LOC~~ HOSPITAL LAB): SARS Coronavirus 2: NEGATIVE

## 2020-02-18 MED ORDER — AEROCHAMBER Z-STAT PLUS/MEDIUM MISC
1.0000 | Freq: Once | Status: AC
  Administered 2020-02-18: 1
  Filled 2020-02-18: qty 1

## 2020-02-18 MED ORDER — BENZONATATE 100 MG PO CAPS: 200.0000 mg | ORAL_CAPSULE | Freq: Three times a day (TID) | ORAL | 0 refills | Status: DC | PRN

## 2020-02-18 MED ORDER — ALBUTEROL SULFATE HFA 108 (90 BASE) MCG/ACT IN AERS
2.0000 | INHALATION_SPRAY | RESPIRATORY_TRACT | Status: DC | PRN
  Administered 2020-02-18: 2 via RESPIRATORY_TRACT
  Filled 2020-02-18: qty 6.7

## 2020-02-18 MED ORDER — SODIUM CHLORIDE 0.9 % IV SOLN
500.0000 mg | Freq: Once | INTRAVENOUS | Status: AC
  Administered 2020-02-18: 500 mg via INTRAVENOUS
  Filled 2020-02-18: qty 500

## 2020-02-18 MED ORDER — AZITHROMYCIN 250 MG PO TABS: 250.0000 mg | ORAL_TABLET | Freq: Every day | ORAL | 0 refills | Status: AC

## 2020-02-18 MED ORDER — SODIUM CHLORIDE 0.9 % IV SOLN
1.0000 g | Freq: Once | INTRAVENOUS | Status: AC
  Administered 2020-02-18: 1 g via INTRAVENOUS
  Filled 2020-02-18: qty 10

## 2020-02-18 NOTE — ED Notes (Addendum)
Pt O2 stayed at 96% while ambulating

## 2020-02-18 NOTE — Discharge Instructions (Signed)
Rest to make sure you are drinking plenty of fluids.  You may take Tylenol or ibuprofen if needed for body aches or fever.  Take your next dose of Zithromax tomorrow evening.  Use the inhaler given taking 2 puffs every 4 hours using a spacer if you have any increased shortness of breath or coughing.  Return here for any worsening symptoms including increased shortness of breath, weakness or any new complications.

## 2020-02-18 NOTE — ED Provider Notes (Signed)
[facility] EMERGENCY DEPARTMENT Provider Note   CSN: [id number] Arrival date & time: 02/18/20  1612     History Chief Complaint  Patient presents with  . Shortness of Breath    Courtney Ponce is a 44 y.o. female with a history as outlined below, most significant for GERD, history of cocaine abuse, depression with history of overdose, who also reports she is a 2 pack/day smoker presenting with a 2-day history of cough which started out as a dry cough but has become productive of a yellow sputum, in addition has had increasing shortness of breath and generalized weakness. She has had no documented fevers but does endorse shaking chills. She denies nausea or vomiting, no abdominal pain, she does have chest pain which is triggered by coughing. She has taken Tylenol for her symptoms with no significant improvement.  The history is provided by the patient.       Past Medical History:  Diagnosis Date  . Abnormal Pap smear   . Anxiety   . Chronic back pain   . Depression   . GERD (gastroesophageal reflux disease)   . Kidney infection   . Neuromuscular disorder [facility])     Patient Active Problem List   Diagnosis Date Noted  . Severe major depression without psychotic features (HCC) 01/15/2016  . Suicide attempt (HCC) 01/14/2016  . Cocaine use disorder, severe, dependence (HCC) 01/12/2016  . Cocaine abuse with cocaine-induced mood disorder (HCC) 01/12/2016  . Suicide attempt by drug ingestion (HCC) 01/12/2016  . QT prolongation 01/11/2016  . SSRI overdose 01/11/2016  . Overdose   . Wheezing   . UTI (lower urinary tract infection) 12/13/2015  . Cocaine abuse (HCC) 12/13/2015  . Muscle weakness (generalized) 12/07/2013  . Lack of coordination 12/07/2013  . Wrist fracture, right 11/24/2013  . Right wrist fracture 10/17/2013  . Distal radius fracture 10/10/2013    Past Surgical History:  Procedure Laterality Date  . COLPOSCOPY  2002  . CYSTOSCOPY WITH URETHRAL DILATATION     age 89  . WISDOM TOOTH EXTRACTION       OB History    Gravida  1   Para  1   Term  1   Preterm      AB      Living  1     SAB      TAB      Ectopic      Multiple      Live Births              Family History  Problem Relation Age of Onset  . Cancer Mother        breast w/metastasis to liver,lung,bone, brain  . Hypertension Sister   . Thyroid disease Sister   . Cancer Maternal Aunt        ovarian w/metastasis to colon    Social History   Tobacco Use  . Smoking status: Current Every Day Smoker    Packs/day: 1.00    Years: 22.00    Pack years: 22.00    Types: Cigarettes  . Smokeless tobacco: Never Used  Substance Use Topics  . Alcohol use: Yes    Comment: socially  . Drug use: Not Currently    Types: Cocaine    Home Medications Prior to Admission medications   Medication Sig Start Date End Date Taking? Authorizing Provider  gabapentin (NEURONTIN) 300 MG capsule Take 1 capsule (300 mg total) by mouth 3 (three) times daily. Take 2 capsules (600mg ) by  mouth at bedtime 01/20/16  Yes Oneta Rack, NP  azithromycin (ZITHROMAX Z-PAK) 250 MG tablet Take 1 tablet (250 mg total) by mouth daily for 4 days. 02/18/20 02/22/20  Burgess Amor, PA-C  benzonatate (TESSALON) 100 MG capsule Take 2 capsules (200 mg total) by mouth 3 (three) times daily as needed for cough. 02/18/20   Burgess Amor, PA-C    Allergies    Shellfish allergy and Wellbutrin [bupropion hcl]  Review of Systems   Review of Systems  Constitutional: Positive for chills and fatigue. Negative for fever.  HENT: Negative for congestion and sore throat.   Eyes: Negative.   Respiratory: Positive for cough, chest tightness and shortness of breath.   Cardiovascular: Negative for chest pain.  Gastrointestinal: Negative for abdominal pain, nausea and vomiting.  Genitourinary: Negative.   Musculoskeletal: Negative for arthralgias, joint swelling and neck pain.  Skin: Negative.  Negative for rash and  wound.  Neurological: Negative for dizziness, weakness, light-headedness, numbness and headaches.  Psychiatric/Behavioral: Negative.     Physical Exam Updated Vital Signs BP 124/74   Pulse 67   Temp 98.1 F (36.7 C) (Oral)   Resp 18   Ht 5\' 3"  (1.6 m)   Wt 74.8 kg   LMP 02/04/2020   SpO2 97%   BMI 29.23 kg/m   Physical Exam Vitals and nursing note reviewed.  Constitutional:      General: She is not in acute distress.    Appearance: She is well-developed. She is not diaphoretic.  HENT:     Head: Normocephalic and atraumatic.     Mouth/Throat:     Mouth: Mucous membranes are moist.  Eyes:     Conjunctiva/sclera: Conjunctivae normal.  Cardiovascular:     Rate and Rhythm: Normal rate and regular rhythm.     Heart sounds: Normal heart sounds.  Pulmonary:     Effort: No accessory muscle usage.     Breath sounds: Wheezing and rhonchi present.     Comments: Rhonchi bilateral lung fields.  occasional expiratory wheeze. Abdominal:     General: Bowel sounds are normal.     Palpations: Abdomen is soft.     Tenderness: There is no abdominal tenderness.  Musculoskeletal:        General: Normal range of motion.     Cervical back: Normal range of motion.  Skin:    General: Skin is warm and dry.  Neurological:     General: No focal deficit present.     Mental Status: She is alert.     ED Results / Procedures / Treatments   Labs (all labs ordered are listed, but only abnormal results are displayed) Labs Reviewed  CBC WITH DIFFERENTIAL/PLATELET - Abnormal; Notable for the following components:      Result Value   WBC 13.0 (*)    Hemoglobin 16.2 (*)    HCT 47.3 (*)    Platelets 125 (*)    Neutro Abs 10.8 (*)    All other components within normal limits  BASIC METABOLIC PANEL - Abnormal; Notable for the following components:   Sodium 134 (*)    CO2 20 (*)    Glucose, Bld 133 (*)    Calcium 8.8 (*)    All other components within normal limits  SARS CORONAVIRUS 2 BY RT  PCR [facility] ORDER, PERFORMED IN [facility] LAB)    EKG EKG Interpretation  Date/Time:  Saturday Feb 18 2020 17:11:20 EDT Ventricular Rate:  84 PR Interval:    QRS Duration:  77 QT Interval:  317 QTC Calculation: 375 R Axis:   55 Text Interpretation: Sinus rhythm Borderline repolarization abnormality Baseline wander in lead(s) II V1 Confirmed by Davonna Belling [phone]) on 02/18/2020 6:46:17 PM   Radiology DG Chest Portable 1 View  Result Date: 02/18/2020 CLINICAL DATA:  Shortness of breath for several days with productive cough EXAM: PORTABLE CHEST 1 VIEW COMPARISON:  01/12/2016 FINDINGS: Cardiac shadow is stable. Aortic calcifications are noted. The lungs are well aerated bilaterally. Patchy opacities are seen in the bases consistent with multifocal pneumonia. Additional airspace opacity is noted in the right upper lobe. No sizable effusion is seen. No bony abnormality is noted. IMPRESSION: Patchy bilateral opacities consistent with multifocal pneumonia. Electronically Signed   By: Inez Catalina M.D.   On: 02/18/2020 16:51    Procedures Procedures (including critical care time)  Medications Ordered in ED Medications  aerochamber Z-Stat Plus/medium 1 each (1 each Other Given 02/18/20 1642)  cefTRIAXone (ROCEPHIN) 1 g in sodium chloride 0.9 % 100 mL IVPB (0 g Intravenous Stopped 02/18/20 1859)  azithromycin (ZITHROMAX) 500 mg in sodium chloride 0.9 % 250 mL IVPB (0 mg Intravenous Stopped 02/18/20 2015)    ED Course  I have reviewed the triage vital signs and the nursing notes.  Pertinent labs & imaging results that were available during my care of the patient were reviewed by me and considered in my medical decision making (see chart for details).    MDM Rules/Calculators/A&P                      Patient with a community-acquired pneumonia.  She has a Curb 65 score of zero.  She was given Rocephin and Zithromax IV.  She was also given albuterol MDI which helped with her  breathing and resolved her wheezing although she does remain rhonchorous.  She is much more comfortable after receiving this treatment. Pt ambulated with pulse ox and was able to maintain o2 sats at 97%.  She is covid negative.  Felt stable for dc home however given strict return precautions for any increased sob, weakness or new sx.  Dandria S Parekh was evaluated in Emergency Department on 02/19/2020 for the symptoms described in the history of present illness. She was evaluated in the context of the global COVID-19 pandemic, which necessitated consideration that the patient might be at risk for infection with the SARS-CoV-2 virus that causes COVID-19. Institutional protocols and algorithms that pertain to the evaluation of patients at risk for COVID-19 are in a state of rapid change based on information released by regulatory bodies including the CDC and federal and state organizations. These policies and algorithms were followed during the patient's care in the ED.  Final Clinical Impression(s) / ED Diagnoses Final diagnoses:  Community acquired pneumonia, unspecified laterality    Rx / DC Orders ED Discharge Orders         Ordered    azithromycin (ZITHROMAX Z-PAK) 250 MG tablet  Daily     02/18/20 1945    benzonatate (TESSALON) 100 MG capsule  3 times daily PRN     02/18/20 1947           Evalee Jefferson, PA-C 02/19/20 0141    Davonna Belling, MD 02/19/20 [phone]

## 2020-02-18 NOTE — ED Triage Notes (Signed)
Pt with sob for past few days, productive cough of yellow/green phlegm.  Pt smells of marijuana, denies use and states she is around others that do.

## 2020-04-13 ENCOUNTER — Other Ambulatory Visit: Payer: Self-pay

## 2020-04-13 ENCOUNTER — Emergency Department (HOSPITAL_COMMUNITY)
Admission: EM | Admit: 2020-04-13 | Discharge: 2020-04-13 | Disposition: A | Discharge status: Home / Self Care | Payer: Self-pay | Attending: Emergency Medicine | Admitting: Emergency Medicine

## 2020-04-13 ENCOUNTER — Encounter (HOSPITAL_COMMUNITY): Payer: Self-pay

## 2020-04-13 ENCOUNTER — Emergency Department (HOSPITAL_COMMUNITY): Payer: Self-pay

## 2020-04-13 DIAGNOSIS — R0981 Nasal congestion: Secondary | ICD-10-CM | POA: Insufficient documentation

## 2020-04-13 DIAGNOSIS — R05 Cough: Secondary | ICD-10-CM | POA: Insufficient documentation

## 2020-04-13 DIAGNOSIS — R5383 Other fatigue: Secondary | ICD-10-CM | POA: Insufficient documentation

## 2020-04-13 DIAGNOSIS — R531 Weakness: Secondary | ICD-10-CM | POA: Insufficient documentation

## 2020-04-13 DIAGNOSIS — Z5321 Procedure and treatment not carried out due to patient leaving prior to being seen by health care provider: Secondary | ICD-10-CM | POA: Insufficient documentation

## 2020-04-13 NOTE — ED Triage Notes (Signed)
Pt to er, pt states that she had pneumonia about a month ago, states that she is here for weakness, fatigue, cough, congestion.  States that she feels better than she did last month, but doesn't feel as though she ever got over her illness.

## 2020-04-13 NOTE — ED Notes (Signed)
Called for Pt in waiting room. No answer.

## 2020-06-03 ENCOUNTER — Other Ambulatory Visit: Payer: Self-pay

## 2020-06-03 ENCOUNTER — Emergency Department (HOSPITAL_COMMUNITY)
Admission: EM | Admit: 2020-06-03 | Discharge: 2020-06-05 | Disposition: A | Discharge status: Other Acute Inpatient Hospital | Payer: Self-pay | Attending: Emergency Medicine | Admitting: Emergency Medicine

## 2020-06-03 ENCOUNTER — Emergency Department (HOSPITAL_COMMUNITY): Payer: Self-pay

## 2020-06-03 ENCOUNTER — Encounter (HOSPITAL_COMMUNITY): Payer: Self-pay

## 2020-06-03 DIAGNOSIS — Z20822 Contact with and (suspected) exposure to covid-19: Secondary | ICD-10-CM | POA: Insufficient documentation

## 2020-06-03 DIAGNOSIS — F1094 Alcohol use, unspecified with alcohol-induced mood disorder: Secondary | ICD-10-CM | POA: Insufficient documentation

## 2020-06-03 DIAGNOSIS — R45851 Suicidal ideations: Secondary | ICD-10-CM | POA: Insufficient documentation

## 2020-06-03 DIAGNOSIS — T50902A Poisoning by unspecified drugs, medicaments and biological substances, intentional self-harm, initial encounter: Secondary | ICD-10-CM

## 2020-06-03 LAB — COMPREHENSIVE METABOLIC PANEL
ALT: 59 U/L — ABNORMAL HIGH (ref 0–44)
AST: 66 U/L — ABNORMAL HIGH (ref 15–41)
Albumin: 3.8 g/dL (ref 3.5–5.0)
Alkaline Phosphatase: 82 U/L (ref 38–126)
Anion gap: 11 (ref 5–15)
BUN: 9 mg/dL (ref 6–20)
CO2: 21 mmol/L — ABNORMAL LOW (ref 22–32)
Calcium: 8.6 mg/dL — ABNORMAL LOW (ref 8.9–10.3)
Chloride: 112 mmol/L — ABNORMAL HIGH (ref 98–111)
Creatinine, Ser: 0.56 mg/dL (ref 0.44–1.00)
GFR calc Af Amer: >60 mL/min (ref >60)
GFR calc non Af Amer: >60 mL/min (ref >60)
Glucose, Bld: 125 mg/dL — ABNORMAL HIGH (ref 70–99)
Potassium: 3.2 mmol/L — ABNORMAL LOW (ref 3.5–5.1)
Sodium: 144 mmol/L (ref 135–145)
Total Bilirubin: 0.3 mg/dL (ref 0.3–1.2)
Total Protein: 7.4 g/dL (ref 6.5–8.1)

## 2020-06-03 LAB — CBC WITH DIFFERENTIAL/PLATELET
Abs Immature Granulocytes: 0.03 10*3/uL (ref 0.00–0.07)
Basophils Absolute: 0 10*3/uL (ref 0.0–0.1)
Basophils Relative: 0 %
Eosinophils Absolute: 0.1 10*3/uL (ref 0.0–0.5)
Eosinophils Relative: 1 %
HCT: 42.4 % (ref 36.0–46.0)
Hemoglobin: 14.2 g/dL (ref 12.0–15.0)
Immature Granulocytes: 1 %
Lymphocytes Relative: 39 %
Lymphs Abs: 2.2 10*3/uL (ref 0.7–4.0)
MCH: 33.8 pg (ref 26.0–34.0)
MCHC: 33.5 g/dL (ref 30.0–36.0)
MCV: 101 fL — ABNORMAL HIGH (ref 80.0–100.0)
Monocytes Absolute: 0.4 10*3/uL (ref 0.1–1.0)
Monocytes Relative: 7 %
Neutro Abs: 3 10*3/uL (ref 1.7–7.7)
Neutrophils Relative %: 52 %
Platelets: 134 10*3/uL — ABNORMAL LOW (ref 150–400)
RBC: 4.2 MIL/uL (ref 3.87–5.11)
RDW: 12.5 % (ref 11.5–15.5)
WBC: 5.7 10*3/uL (ref 4.0–10.5)
nRBC: 0 % (ref 0.0–0.2)

## 2020-06-03 LAB — ACETAMINOPHEN LEVEL: Acetaminophen (Tylenol), Serum: <10 ug/mL — ABNORMAL LOW (ref 10–30)

## 2020-06-03 LAB — ETHANOL: Alcohol, Ethyl (B): 244 mg/dL — ABNORMAL HIGH (ref <10)

## 2020-06-03 LAB — SALICYLATE LEVEL: Salicylate Lvl: <7 mg/dL — ABNORMAL LOW (ref 7.0–30.0)

## 2020-06-03 LAB — POC URINE PREG, ED: Preg Test, Ur: NEGATIVE

## 2020-06-03 MED ORDER — AMOXICILLIN-POT CLAVULANATE 875-125 MG PO TABS
1.0000 | ORAL_TABLET | Freq: Two times a day (BID) | ORAL | Status: DC
  Administered 2020-06-03 – 2020-06-05 (×4): 1 via ORAL
  Filled 2020-06-03 (×4): qty 1

## 2020-06-03 MED ORDER — NICOTINE 14 MG/24HR TD PT24
14.0000 mg | MEDICATED_PATCH | Freq: Once | TRANSDERMAL | Status: AC
  Administered 2020-06-03: 14 mg via TRANSDERMAL
  Filled 2020-06-03: qty 1

## 2020-06-03 NOTE — ED Notes (Signed)
Patient transported to CT 
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
## 2020-06-03 NOTE — ED Notes (Signed)
Poison Control signed off on patient. No new recommedations.
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
## 2020-06-03 NOTE — ED Notes (Signed)
Poison Control notified of pt's ingestion and recommends supportive care and antiemetics for possible GI upset due to Aleve.   Recommend observation for 6 hours from ingestion, pt asymptomatic and having normal vital signs.

## 2020-06-04 LAB — RAPID URINE DRUG SCREEN, HOSP PERFORMED
Amphetamines: NOT DETECTED
Barbiturates: NOT DETECTED
Benzodiazepines: NOT DETECTED
Cocaine: NOT DETECTED
Opiates: NOT DETECTED
Tetrahydrocannabinol: NOT DETECTED

## 2020-06-04 LAB — SARS CORONAVIRUS 2 BY RT PCR (HOSPITAL ORDER, PERFORMED IN ~~LOC~~ HOSPITAL LAB): SARS Coronavirus 2: NEGATIVE

## 2020-06-04 MED ORDER — LORAZEPAM 2 MG/ML IJ SOLN
0.0000 mg | Freq: Four times a day (QID) | INTRAMUSCULAR | Status: DC
  Administered 2020-06-04: 2 mg via INTRAVENOUS
  Filled 2020-06-04: qty 1

## 2020-06-04 MED ORDER — LORAZEPAM 1 MG PO TABS
0.0000 mg | ORAL_TABLET | Freq: Four times a day (QID) | ORAL | Status: DC
  Administered 2020-06-04 (×2): 1 mg via ORAL
  Filled 2020-06-04 (×2): qty 1

## 2020-06-04 MED ORDER — THIAMINE HCL 100 MG PO TABS
100.0000 mg | ORAL_TABLET | Freq: Every day | ORAL | Status: DC
  Administered 2020-06-04 – 2020-06-05 (×2): 100 mg via ORAL
  Filled 2020-06-04 (×2): qty 1

## 2020-06-04 MED ORDER — ACETAMINOPHEN 325 MG PO TABS
650.0000 mg | ORAL_TABLET | Freq: Once | ORAL | Status: AC
  Administered 2020-06-04: 650 mg via ORAL
  Filled 2020-06-04: qty 2

## 2020-06-04 MED ORDER — ONDANSETRON HCL 4 MG/2ML IJ SOLN
4.0000 mg | Freq: Once | INTRAMUSCULAR | Status: AC
  Administered 2020-06-04: 4 mg via INTRAVENOUS
  Filled 2020-06-04: qty 2

## 2020-06-04 MED ORDER — OXYCODONE-ACETAMINOPHEN 5-325 MG PO TABS
1.0000 | ORAL_TABLET | Freq: Three times a day (TID) | ORAL | Status: DC | PRN
  Administered 2020-06-04 – 2020-06-05 (×3): 1 via ORAL
  Filled 2020-06-04 (×3): qty 1

## 2020-06-04 MED ORDER — LORAZEPAM 1 MG PO TABS: 0.0000 mg | ORAL_TABLET | Freq: Two times a day (BID) | ORAL | Status: DC

## 2020-06-04 MED ORDER — LORAZEPAM 2 MG/ML IJ SOLN: 0.0000 mg | Freq: Two times a day (BID) | INTRAMUSCULAR | Status: DC

## 2020-06-04 MED ORDER — THIAMINE HCL 100 MG/ML IJ SOLN: 100.0000 mg | Freq: Every day | INTRAMUSCULAR | Status: DC

## 2020-06-04 NOTE — BHH Counselor (Signed)
Disposition:   Per LaShunda Thomas, NP, continue to recommend inpatient treatment   

## 2020-06-04 NOTE — ED Notes (Signed)
Pt spoke with Candelaria Stagers

## 2020-06-04 NOTE — ED Notes (Addendum)
Patient is asleep at this time.equal rise and fall of chest
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
## 2020-06-04 NOTE — ED Notes (Signed)
Pts IV removed, changed out of gown into appropriate psych attire, all cords locked behind garage door

## 2020-06-04 NOTE — ED Notes (Signed)
Chaplain is in the room with patient at this time

## 2020-06-04 NOTE — ED Notes (Signed)
Patient was given a soda. Cleaned mess up in room. Put belongings in locker.
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
## 2020-06-04 NOTE — Care Management (Signed)
Writer referred patient to the following facilities:    [facility]  [facility]  [facility]  [facility]  [facility]  [facility]

## 2020-06-04 NOTE — ED Notes (Signed)
Detective is in room talking with patient.

## 2020-06-04 NOTE — ED Notes (Signed)
Patient was given a lunch tray.

## 2020-06-04 NOTE — ED Notes (Addendum)
Patient is in a okay mood, with moments of being upset.asked to speak with chaplain and as well as wants a bible. RN was notified.  Coloring and watching tv at this time.

## 2020-06-05 ENCOUNTER — Other Ambulatory Visit: Payer: Self-pay

## 2020-06-05 ENCOUNTER — Encounter (HOSPITAL_COMMUNITY): Payer: Self-pay | Admitting: Behavioral Health

## 2020-06-05 ENCOUNTER — Inpatient Hospital Stay (HOSPITAL_COMMUNITY)
Admission: AD | Admit: 2020-06-05 | Discharge: 2020-06-14 | DRG: 885 | Disposition: A | Discharge status: Home / Self Care | Payer: Federal, State, Local not specified - Other | Source: Intra-hospital | Attending: Psychiatry | Admitting: Psychiatry

## 2020-06-05 DIAGNOSIS — K219 Gastro-esophageal reflux disease without esophagitis: Secondary | ICD-10-CM | POA: Diagnosis present

## 2020-06-05 DIAGNOSIS — F10239 Alcohol dependence with withdrawal, unspecified: Secondary | ICD-10-CM | POA: Diagnosis present

## 2020-06-05 DIAGNOSIS — F17210 Nicotine dependence, cigarettes, uncomplicated: Secondary | ICD-10-CM | POA: Diagnosis present

## 2020-06-05 DIAGNOSIS — Z79899 Other long term (current) drug therapy: Secondary | ICD-10-CM | POA: Diagnosis not present

## 2020-06-05 DIAGNOSIS — F329 Major depressive disorder, single episode, unspecified: Secondary | ICD-10-CM | POA: Diagnosis present

## 2020-06-05 DIAGNOSIS — T426X2A Poisoning by other antiepileptic and sedative-hypnotic drugs, intentional self-harm, initial encounter: Secondary | ICD-10-CM | POA: Diagnosis present

## 2020-06-05 DIAGNOSIS — F515 Nightmare disorder: Secondary | ICD-10-CM | POA: Diagnosis present

## 2020-06-05 DIAGNOSIS — Z915 Personal history of self-harm: Secondary | ICD-10-CM

## 2020-06-05 DIAGNOSIS — G4700 Insomnia, unspecified: Secondary | ICD-10-CM | POA: Diagnosis present

## 2020-06-05 DIAGNOSIS — F322 Major depressive disorder, single episode, severe without psychotic features: Principal | ICD-10-CM | POA: Diagnosis present

## 2020-06-05 DIAGNOSIS — Z20822 Contact with and (suspected) exposure to covid-19: Secondary | ICD-10-CM | POA: Diagnosis present

## 2020-06-05 DIAGNOSIS — Y908 Blood alcohol level of 240 mg/100 ml or more: Secondary | ICD-10-CM | POA: Diagnosis present

## 2020-06-05 DIAGNOSIS — F419 Anxiety disorder, unspecified: Secondary | ICD-10-CM | POA: Diagnosis present

## 2020-06-05 DIAGNOSIS — T39312A Poisoning by propionic acid derivatives, intentional self-harm, initial encounter: Secondary | ICD-10-CM | POA: Diagnosis present

## 2020-06-05 MED ORDER — IBUPROFEN 600 MG PO TABS
600.0000 mg | ORAL_TABLET | Freq: Four times a day (QID) | ORAL | Status: DC | PRN
  Administered 2020-06-05 – 2020-06-14 (×16): 600 mg via ORAL
  Filled 2020-06-05 (×16): qty 1

## 2020-06-05 MED ORDER — SERTRALINE HCL 25 MG PO TABS
25.0000 mg | ORAL_TABLET | Freq: Every day | ORAL | Status: DC
  Administered 2020-06-05 – 2020-06-08 (×4): 25 mg via ORAL
  Filled 2020-06-05 (×7): qty 1

## 2020-06-05 MED ORDER — LORAZEPAM 1 MG PO TABS: 0.0000 mg | ORAL_TABLET | Freq: Four times a day (QID) | ORAL | Status: DC

## 2020-06-05 MED ORDER — OXYCODONE-ACETAMINOPHEN 5-325 MG PO TABS
1.0000 | ORAL_TABLET | ORAL | Status: AC
  Administered 2020-06-05: 1 via ORAL
  Filled 2020-06-05: qty 1

## 2020-06-05 MED ORDER — LORAZEPAM 2 MG/ML IJ SOLN: 0.0000 mg | Freq: Four times a day (QID) | INTRAMUSCULAR | Status: DC

## 2020-06-05 MED ORDER — THIAMINE HCL 100 MG PO TABS
100.0000 mg | ORAL_TABLET | Freq: Every day | ORAL | Status: DC
  Administered 2020-06-06 – 2020-06-14 (×9): 100 mg via ORAL
  Filled 2020-06-05 (×14): qty 1

## 2020-06-05 MED ORDER — LORAZEPAM 1 MG PO TABS: 0.0000 mg | ORAL_TABLET | Freq: Two times a day (BID) | ORAL | Status: DC

## 2020-06-05 MED ORDER — LORAZEPAM 1 MG PO TABS
1.0000 mg | ORAL_TABLET | ORAL | Status: AC
  Administered 2020-06-05: 1 mg via ORAL
  Filled 2020-06-05: qty 1

## 2020-06-05 MED ORDER — PANTOPRAZOLE SODIUM 40 MG PO TBEC
40.0000 mg | DELAYED_RELEASE_TABLET | Freq: Every day | ORAL | Status: DC
  Administered 2020-06-05 – 2020-06-14 (×10): 40 mg via ORAL
  Filled 2020-06-05 (×3): qty 1
  Filled 2020-06-05: qty 7
  Filled 2020-06-05 (×6): qty 1
  Filled 2020-06-05 (×3): qty 7

## 2020-06-05 MED ORDER — NICOTINE 14 MG/24HR TD PT24
14.0000 mg | MEDICATED_PATCH | Freq: Once | TRANSDERMAL | Status: AC
  Administered 2020-06-05: 14 mg via TRANSDERMAL
  Filled 2020-06-05 (×2): qty 1

## 2020-06-05 MED ORDER — GABAPENTIN 300 MG PO CAPS
900.0000 mg | ORAL_CAPSULE | Freq: Two times a day (BID) | ORAL | Status: DC
  Administered 2020-06-05 – 2020-06-14 (×18): 900 mg via ORAL
  Filled 2020-06-05: qty 42
  Filled 2020-06-05 (×3): qty 3
  Filled 2020-06-05: qty 42
  Filled 2020-06-05: qty 3
  Filled 2020-06-05: qty 42
  Filled 2020-06-05 (×2): qty 3
  Filled 2020-06-05: qty 42
  Filled 2020-06-05: qty 3
  Filled 2020-06-05 (×2): qty 42
  Filled 2020-06-05 (×4): qty 3
  Filled 2020-06-05: qty 42
  Filled 2020-06-05 (×7): qty 3
  Filled 2020-06-05: qty 42

## 2020-06-05 MED ORDER — OXYCODONE-ACETAMINOPHEN 5-325 MG PO TABS
1.0000 | ORAL_TABLET | Freq: Three times a day (TID) | ORAL | Status: DC | PRN
  Administered 2020-06-05: 1 via ORAL
  Filled 2020-06-05: qty 1

## 2020-06-05 MED ORDER — THIAMINE HCL 100 MG/ML IJ SOLN: 100.0000 mg | Freq: Every day | INTRAMUSCULAR | Status: DC

## 2020-06-05 MED ORDER — OXYCODONE HCL 5 MG PO TABS
10.0000 mg | ORAL_TABLET | Freq: Three times a day (TID) | ORAL | Status: DC | PRN
  Administered 2020-06-06 – 2020-06-08 (×6): 10 mg via ORAL
  Filled 2020-06-05 (×6): qty 2

## 2020-06-05 MED ORDER — LORAZEPAM 1 MG PO TABS
1.0000 mg | ORAL_TABLET | Freq: Four times a day (QID) | ORAL | Status: DC | PRN
  Administered 2020-06-05 – 2020-06-07 (×5): 1 mg via ORAL
  Filled 2020-06-05 (×5): qty 1

## 2020-06-05 MED ORDER — OXYCODONE-ACETAMINOPHEN 5-325 MG PO TABS: 1.0000 | ORAL_TABLET | Freq: Three times a day (TID) | ORAL | Status: DC | PRN

## 2020-06-05 MED ORDER — ONDANSETRON 4 MG PO TBDP
4.0000 mg | ORAL_TABLET | Freq: Four times a day (QID) | ORAL | Status: DC | PRN
  Administered 2020-06-05 – 2020-06-06 (×2): 4 mg via ORAL
  Filled 2020-06-05 (×2): qty 1

## 2020-06-05 MED ORDER — LORAZEPAM 1 MG PO TABS: 1.0000 mg | ORAL_TABLET | ORAL | Status: DC

## 2020-06-05 MED ORDER — AMOXICILLIN-POT CLAVULANATE 875-125 MG PO TABS
1.0000 | ORAL_TABLET | Freq: Two times a day (BID) | ORAL | Status: DC
  Administered 2020-06-05 – 2020-06-14 (×18): 1 via ORAL
  Filled 2020-06-05 (×26): qty 1

## 2020-06-05 MED ORDER — LORAZEPAM 2 MG/ML IJ SOLN: 0.0000 mg | Freq: Two times a day (BID) | INTRAMUSCULAR | Status: DC

## 2020-06-05 NOTE — ED Notes (Signed)
Pt ate 100% of her breakfast and got up to use the bathroom.

## 2020-06-05 NOTE — ED Notes (Signed)
Pt using phone to attempt to contact family, unsuccessful. Pt asking to speak with Mirian Capuchin from [employer].

## 2020-06-05 NOTE — Progress Notes (Signed)
Patient ID: KAMIL HANIGAN, female   DOB: 1976-08-28, 44 y.o.   MRN: [id number]   [place] NOVEL CORONAVIRUS (COVID-19) DAILY CHECK-OFF SYMPTOMS - answer yes or no to each - every day NO YES  Have you had a fever in the past 24 hours?   Fever (Temp > 37.80C / 100F) X   Have you had any of these symptoms in the past 24 hours?  New Cough   Sore Throat    Shortness of Breath   Difficulty Breathing   Unexplained Body Aches   X   Have you had any one of these symptoms in the past 24 hours not related to allergies?    Runny Nose   Nasal Congestion   Sneezing   X   If you have had runny nose, nasal congestion, sneezing in the past 24 hours, has it worsened?  X   EXPOSURES - check yes or no X   Have you traveled outside the state in the past 14 days?  X   Have you been in contact with someone with a confirmed diagnosis of COVID-19 or PUI in the past 14 days without wearing appropriate PPE?  X   Have you been living in the same home as a person with confirmed diagnosis of COVID-19 or a PUI (household contact)?    X   Have you been diagnosed with COVID-19?    X              What to do next: Answered NO to all: Answered YES to anything:   Proceed with unit schedule Follow the [facility] Inpatient Flowsheet.
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
## 2020-06-05 NOTE — ED Notes (Signed)
Called [facility] for transport to [facility] 
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
## 2020-06-05 NOTE — ED Notes (Signed)
Pt up to bedside commode. Pt had a bowel movement and back in bed watching TV.
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
## 2020-06-05 NOTE — Progress Notes (Signed)
Adult Psychoeducational Group Note  Date:  06/05/2020 Time:  9:56 PM  Group Topic/Focus:  Wrap-Up Group:   The focus of this group is to help patients review their daily goal of treatment and discuss progress on daily workbooks.  Participation Level:  Active  Participation Quality:  Appropriate  Affect:  Appropriate  Cognitive:  Appropriate  Insight: Appropriate  Engagement in Group:  Engaged  Modes of Intervention:  Discussion  Additional Comments:  Pt attend wrap up group. Her day was 8. The one positive thing that happened today she made it here to be a patient.  Charna Busman Long 06/05/2020, 9:56 PM
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
## 2020-06-06 DIAGNOSIS — F322 Major depressive disorder, single episode, severe without psychotic features: Principal | ICD-10-CM

## 2020-06-06 LAB — LIPID PANEL
Cholesterol: 154 mg/dL (ref 0–200)
HDL: 35 mg/dL — ABNORMAL LOW (ref >40)
LDL Cholesterol: 80 mg/dL (ref 0–99)
Total CHOL/HDL Ratio: 4.4 RATIO
Triglycerides: 194 mg/dL — ABNORMAL HIGH (ref <150)
VLDL: 39 mg/dL (ref 0–40)

## 2020-06-06 LAB — TSH: TSH: 2.58 u[IU]/mL (ref 0.350–4.500)

## 2020-06-06 LAB — HEMOGLOBIN A1C
Hgb A1c MFr Bld: 5.5 % (ref 4.8–5.6)
Mean Plasma Glucose: 111.15 mg/dL

## 2020-06-06 MED ORDER — NICOTINE 21 MG/24HR TD PT24
21.0000 mg | MEDICATED_PATCH | Freq: Every day | TRANSDERMAL | Status: DC
  Administered 2020-06-06 – 2020-06-14 (×9): 21 mg via TRANSDERMAL
  Filled 2020-06-06 (×10): qty 1

## 2020-06-06 MED ORDER — PRAZOSIN HCL 1 MG PO CAPS
1.0000 mg | ORAL_CAPSULE | Freq: Every day | ORAL | Status: DC
  Administered 2020-06-06 – 2020-06-13 (×8): 1 mg via ORAL
  Filled 2020-06-06: qty 7
  Filled 2020-06-06 (×3): qty 1
  Filled 2020-06-06: qty 7
  Filled 2020-06-06: qty 1
  Filled 2020-06-06: qty 7
  Filled 2020-06-06: qty 1
  Filled 2020-06-06: qty 7
  Filled 2020-06-06: qty 1

## 2020-06-06 MED ORDER — NICOTINE 21 MG/24HR TD PT24
MEDICATED_PATCH | TRANSDERMAL | Status: AC
  Administered 2020-06-06: 21 mg
  Filled 2020-06-06: qty 1

## 2020-06-06 MED ORDER — CLONIDINE HCL 0.1 MG PO TABS
0.1000 mg | ORAL_TABLET | Freq: Three times a day (TID) | ORAL | Status: DC | PRN
  Administered 2020-06-06: 0.1 mg via ORAL
  Filled 2020-06-06: qty 1

## 2020-06-06 MED ORDER — NAPHAZOLINE-GLYCERIN 0.012-0.2 % OP SOLN
1.0000 [drp] | Freq: Four times a day (QID) | OPHTHALMIC | Status: DC | PRN
  Administered 2020-06-06 – 2020-06-14 (×10): 1 [drp] via OPHTHALMIC
  Filled 2020-06-06: qty 15
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
## 2020-06-06 NOTE — Progress Notes (Signed)
Adult Psychoeducational Group Note  Date:  06/06/2020 Time:  12:44 PM  Group Topic/Focus:  Goals Group:   The focus of this group is to help patients establish daily goals to achieve during treatment and discuss how the patient can incorporate goal setting into their daily lives to aide in recovery.  Participation Level:  Active  Participation Quality:  Appropriate  Affect:  Appropriate  Cognitive:  Alert  Insight: Appropriate  Engagement in Group:  Engaged  Modes of Intervention:  Discussion  Additional Comments:  Pt attended group and participated in discussion.  Modesty Rudy R Meloni Hinz 06/06/2020, 12:44 PM
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
## 2020-06-06 NOTE — Progress Notes (Signed)
[facility] Group Notes:  (Nursing/MHT/Case Management/Adjunct)  Date:  06/06/2020  Time:  2030  Type of Therapy:  wrap up group  Participation Level:  Active  Participation Quality:  Appropriate, Attentive, Sharing and Supportive  Affect:  Appropriate  Cognitive:  Alert  Insight:  Improving  Engagement in Group:  Engaged  Modes of Intervention:  Clarification, Education and Support  Summary of Progress/Problems: Positive thinking and positive change were discussed.   Marcille Buffy 06/06/2020, 9:59 PM
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
## 2020-06-07 MED ORDER — ENSURE ENLIVE PO LIQD
237.0000 mL | Freq: Two times a day (BID) | ORAL | Status: DC
  Administered 2020-06-07 – 2020-06-13 (×8): 237 mL via ORAL

## 2020-06-07 MED ORDER — POLYETHYLENE GLYCOL 3350 17 G PO PACK
17.0000 g | PACK | Freq: Every day | ORAL | Status: DC
  Administered 2020-06-07 – 2020-06-13 (×6): 17 g via ORAL
  Filled 2020-06-07 (×2): qty 1
  Filled 2020-06-07: qty 2
  Filled 2020-06-07: qty 1
  Filled 2020-06-07: qty 2
  Filled 2020-06-07 (×3): qty 1
  Filled 2020-06-07 (×2): qty 2

## 2020-06-07 MED ORDER — SALINE SPRAY 0.65 % NA SOLN
1.0000 | NASAL | Status: DC | PRN
  Administered 2020-06-07 – 2020-06-14 (×16): 1 via NASAL
  Filled 2020-06-07: qty 44

## 2020-06-07 MED ORDER — QUETIAPINE FUMARATE 50 MG PO TABS
50.0000 mg | ORAL_TABLET | Freq: Every day | ORAL | Status: DC
  Administered 2020-06-07 – 2020-06-08 (×2): 50 mg via ORAL
  Filled 2020-06-07 (×5): qty 1

## 2020-06-07 NOTE — Progress Notes (Signed)
   06/07/20 0649  Vital Signs  Pulse Rate 66  Pulse Rate Source Monitor  Resp 18  BP (!) 129/96  BP Location Left Arm  BP Method Automatic  Patient Position (if appropriate) Standing  Oxygen Therapy  SpO2 99 %

## 2020-06-07 NOTE — Progress Notes (Signed)
Patient rated her day as an 8 out of 10 since she slept all day despite experiencing some pain. Her goal for tomorrow is to work on taking steps towards preparing for discharge.
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
## 2020-06-07 NOTE — B[facility] Counselor (Signed)
CSW called [facility] regarding possible placement. Per [place] with [facility] this patient will have to call and speak with staff directly.   CSW provided this information to patient and encouraged patient to reach out to [facility] on this date.   Ruthann Cancer MSW, LCSW Clincal Social Worker  [facility]
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
## 2020-06-08 LAB — HEPATIC FUNCTION PANEL
ALT: 54 U/L — ABNORMAL HIGH (ref 0–44)
AST: 55 U/L — ABNORMAL HIGH (ref 15–41)
Albumin: 3.5 g/dL (ref 3.5–5.0)
Alkaline Phosphatase: 75 U/L (ref 38–126)
Bilirubin, Direct: <0.1 mg/dL (ref 0.0–0.2)
Total Bilirubin: 0.5 mg/dL (ref 0.3–1.2)
Total Protein: 6.7 g/dL (ref 6.5–8.1)

## 2020-06-08 MED ORDER — OXYCODONE HCL 5 MG PO TABS
5.0000 mg | ORAL_TABLET | Freq: Four times a day (QID) | ORAL | Status: DC | PRN
  Administered 2020-06-08 – 2020-06-12 (×13): 5 mg via ORAL
  Filled 2020-06-08 (×13): qty 1

## 2020-06-08 MED ORDER — HYDROXYZINE HCL 25 MG PO TABS
25.0000 mg | ORAL_TABLET | Freq: Three times a day (TID) | ORAL | Status: DC | PRN
  Administered 2020-06-08 – 2020-06-11 (×5): 25 mg via ORAL
  Filled 2020-06-08 (×6): qty 1

## 2020-06-08 MED ORDER — SERTRALINE HCL 50 MG PO TABS
50.0000 mg | ORAL_TABLET | Freq: Every day | ORAL | Status: DC
  Administered 2020-06-09: 50 mg via ORAL
  Filled 2020-06-08 (×2): qty 1

## 2020-06-08 MED ORDER — ACETAMINOPHEN 325 MG PO TABS
650.0000 mg | ORAL_TABLET | Freq: Four times a day (QID) | ORAL | Status: DC | PRN
  Administered 2020-06-09 – 2020-06-12 (×3): 650 mg via ORAL
  Filled 2020-06-08 (×3): qty 2
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
## 2020-06-08 NOTE — Progress Notes (Signed)
Patient did attend the evening speaker AA meeting.  

## 2020-06-08 NOTE — Progress Notes (Signed)
   06/08/20 2308  Psych Admission Type (Psych Patients Only)  Admission Status Voluntary  Psychosocial Assessment  Patient Complaints Anxiety  Eye Contact Fair  Facial Expression Anxious;Pained  Affect Anxious  Speech Logical/coherent  Interaction Assertive  Motor Activity Slow  Appearance/Hygiene Unremarkable  Behavior Characteristics Cooperative  Mood Anxious;Depressed  Thought Process  Coherency WDL  Content WDL  Delusions None reported or observed  Perception WDL  Hallucination None reported or observed  Judgment WDL  Confusion None  Danger to Self  Current suicidal ideation? Denies  Danger to Others  Danger to Others None reported or observed

## 2020-06-08 NOTE — Progress Notes (Signed)
Recreation Therapy Notes  Date: 9.3.21 Time: 0930 Location: [address] Dayroom  Group Topic: Stress Management  Goal Area(s) Addresses:  Patient will identify positive stress management techniques. Patient will identify benefits of using stress management post d/c.  Intervention: Stress Management  Activity:  Guided Imagery.  LRT read [place] script that focused on relaxing in [place].  Patients were to listen and follow along as script was read to fully engage.    Education:  Stress Management, Discharge Planning.   Education Outcome: Acknowledges Education  Clinical Observations/Feedback: Pt did not attend activity.    Courtney Ponce, LRT/CTRS         Courtney Ponce 06/08/2020 11:50 AM 

## 2020-06-09 MED ORDER — PSEUDOEPHEDRINE HCL 30 MG PO TABS
30.0000 mg | ORAL_TABLET | Freq: Three times a day (TID) | ORAL | Status: DC | PRN
  Administered 2020-06-10 – 2020-06-12 (×5): 30 mg via ORAL
  Filled 2020-06-09 (×5): qty 1

## 2020-06-09 MED ORDER — WHITE PETROLATUM EX OINT
TOPICAL_OINTMENT | CUTANEOUS | Status: AC
  Filled 2020-06-09: qty 5

## 2020-06-09 MED ORDER — SERTRALINE HCL 100 MG PO TABS
100.0000 mg | ORAL_TABLET | Freq: Every day | ORAL | Status: DC
  Administered 2020-06-10 – 2020-06-14 (×5): 100 mg via ORAL
  Filled 2020-06-09 (×2): qty 1
  Filled 2020-06-09 (×2): qty 7
  Filled 2020-06-09: qty 1
  Filled 2020-06-09 (×2): qty 7

## 2020-06-09 MED ORDER — POTASSIUM CHLORIDE CRYS ER 20 MEQ PO TBCR
20.0000 meq | EXTENDED_RELEASE_TABLET | Freq: Once | ORAL | Status: AC
  Administered 2020-06-09: 20 meq via ORAL
  Filled 2020-06-09: qty 1

## 2020-06-09 MED ORDER — QUETIAPINE FUMARATE 100 MG PO TABS
100.0000 mg | ORAL_TABLET | Freq: Every day | ORAL | Status: DC
  Administered 2020-06-09 – 2020-06-10 (×2): 100 mg via ORAL
  Filled 2020-06-09: qty 1
  Filled 2020-06-09: qty 7
  Filled 2020-06-09 (×3): qty 1
  Filled 2020-06-09: qty 7

## 2020-06-09 NOTE — [facility] Group Notes (Signed)
Adult Psychoeducational Group Note  Date:  06/09/2020 Time:  11:12 AM  Group Topic/Focus:  Goals Group:   The focus of this group is to help patients establish daily goals to achieve during treatment and discuss how the patient can incorporate goal setting into their daily lives to aide in recovery.  Participation Level:  Active  Participation Quality:  Appropriate  Affect:  Flat  Cognitive:  Oriented  Insight: Appropriate  Engagement in Group:  Engaged  Modes of Intervention:  Discussion, Education and Exploration. support  Additional Comments:  Rates her energy at a 2/10. Was walking in and out of the group several times. Stated that she wanted to give herself a verbal affirmation.  Courtney Ponce A 06/09/2020, 11:12 AM
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
## 2020-06-09 NOTE — Progress Notes (Signed)
   06/09/20 2204  Psych Admission Type (Psych Patients Only)  Admission Status Voluntary  Psychosocial Assessment  Patient Complaints Anxiety  Eye Contact Fair  Facial Expression Anxious  Affect Appropriate to circumstance  Speech Logical/coherent  Interaction Assertive  Motor Activity Slow  Appearance/Hygiene Unremarkable  Behavior Characteristics Appropriate to situation  Mood Anxious;Pleasant  Thought Process  Coherency WDL  Content WDL  Delusions None reported or observed  Perception WDL  Hallucination None reported or observed  Judgment WDL  Confusion None  Danger to Self  Current suicidal ideation? Denies  Danger to Others  Danger to Others None reported or observed
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
## 2020-06-09 NOTE — [facility] Group Notes (Signed)
Adult Psychoeducational Group Note  Date:  06/09/2020 Time:  2:56 PM  Group Topic/Focus:  Identifying Needs:   The focus of this group is to help patients identify their personal needs that have been historically problematic and identify healthy behaviors to address their needs.  Participation Level:  Active  Participation Quality:  Appropriate  Affect:  Appropriate  Cognitive:  Appropriate  Insight: Improving  Engagement in Group:  Engaged  Modes of Intervention:  Discussion, Education, Exploration and Support  Additional Comments:  Pt rated her energy at a 1/10. Was active in the group, asking questions and offering information.   Vira Blanco A 06/09/2020, 2:56 PM
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
## 2020-06-09 NOTE — Progress Notes (Signed)
   06/09/20 2200  COVID-19 Daily Checkoff  Have you had a fever (temp > 37.80C/100F)  in the past 24 hours?  No  If you have had runny nose, nasal congestion, sneezing in the past 24 hours, has it worsened? No  COVID-19 EXPOSURE  Have you traveled outside the state in the past 14 days? No  Have you been in contact with someone with a confirmed diagnosis of COVID-19 or PUI in the past 14 days without wearing appropriate PPE? No  Have you been living in the same home as a person with confirmed diagnosis of COVID-19 or a PUI (household contact)? No  Have you been diagnosed with COVID-19? No

## 2020-06-09 NOTE — Progress Notes (Signed)
Pt presents with a flat affect and depressed mood. She rates depression 8/10, anxiety 8/10 and hopelessness 9/10. She reported passive SI on her self inventory sheet but denies active SI with Clinical research associate, denies a plan or intent. She verbally contracts for safety and verbalizes understanding.  She reports fair sleep at bedtime. Her stated goal, "healing more and find out more about the situation."   Orders reviewed. Vital sign reviewed. Verbal support provided. Pt encouraged to attend groups. 15 minute checks performed for safety.   Pt compliant with tx plan. She denies having any medication side effects.

## 2020-06-10 LAB — HEPATIC FUNCTION PANEL
ALT: 51 U/L — ABNORMAL HIGH (ref 0–44)
AST: 47 U/L — ABNORMAL HIGH (ref 15–41)
Albumin: 3.2 g/dL — ABNORMAL LOW (ref 3.5–5.0)
Alkaline Phosphatase: 69 U/L (ref 38–126)
Bilirubin, Direct: <0.1 mg/dL (ref 0.0–0.2)
Total Bilirubin: 0.1 mg/dL — ABNORMAL LOW (ref 0.3–1.2)
Total Protein: 6.6 g/dL (ref 6.5–8.1)

## 2020-06-10 LAB — BASIC METABOLIC PANEL
Anion gap: 6 (ref 5–15)
BUN: 17 mg/dL (ref 6–20)
CO2: 27 mmol/L (ref 22–32)
Calcium: 8.6 mg/dL — ABNORMAL LOW (ref 8.9–10.3)
Chloride: 106 mmol/L (ref 98–111)
Creatinine, Ser: 0.77 mg/dL (ref 0.44–1.00)
GFR calc Af Amer: >60 mL/min (ref >60)
GFR calc non Af Amer: >60 mL/min (ref >60)
Glucose, Bld: 102 mg/dL — ABNORMAL HIGH (ref 70–99)
Potassium: 4.3 mmol/L (ref 3.5–5.1)
Sodium: 139 mmol/L (ref 135–145)
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
## 2020-06-10 NOTE — [facility] Group Notes (Signed)
[facility] LCSW Group Therapy Note  06/10/2020    Type of Therapy and Topic:  Group Therapy:  A Hero Worthy of Support  Participation Level:  Active   Description of Group:  Patients in this group were introduced to the concept that additional supports including self-support are an essential part of recovery.  Matching needs with supports to help fulfill those needs was explained.  Establishing boundaries that can gradually be increased or decreased was described, with patients giving their own examples of establishing appropriate boundaries in their lives.  A song entitled "My Own Hero" was played and a group discussion ensued in which patients stated it inspired them to help themselves in order to succeed, because other people cannot achieve their goals such as sobriety or stability for them.  A song was played called "I Am Enough" which led to a discussion about being willing to believe we are worth the effort of being a self-support.   Therapeutic Goals: 1)  demonstrate the importance of being a key part of one's own support system 2)  discuss various available supports 3)  encourage patient to use music as part of their self-support and focus on goals 4)  elicit ideas from patients about supports that need to be added   Summary of Patient Progress:  The patient expressed that her daughter is a healthy support but lives a long distance away.  Other patients were encouraging that this can still be a source of support if she will allow it.  She said that she has no unhealthy supports in her life now because she has cut everybody else out in her life.  Therapeutic Modalities:   Motivational Interviewing Activity  Lynnell Chad

## 2020-06-10 NOTE — Progress Notes (Signed)
   06/10/20 2100  COVID-19 Daily Checkoff  Have you had a fever (temp > 37.80C/100F)  in the past 24 hours?  No  COVID-19 EXPOSURE  Have you traveled outside the state in the past 14 days? No  Have you been in contact with someone with a confirmed diagnosis of COVID-19 or PUI in the past 14 days without wearing appropriate PPE? No  Have you been living in the same home as a person with confirmed diagnosis of COVID-19 or a PUI (household contact)? No  Have you been diagnosed with COVID-19? No
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
## 2020-06-10 NOTE — [facility] Group Notes (Signed)
Adult Psychoeducational Group Not Date:  06/10/2020 Time:  9:00am-9:45am  Group Topic/Focus: PROGRESSIVE RELAXATION. A group where deep breathing is taught and tensing and relaxation muscle groups is used. Imagery is used as well.  Pts are asked to imagine 3 pillars that hold them up when they are not able to hold themselves up.  Participation Level:  Active  Participation Quality:  Appropriate  Affect:  Appropriate  Cognitive:  Oriented  Insight: Improving  Engagement in Group:  Engaged  Modes of Intervention:  Activity, Discussion, Education, and Support  Additional Comments:  Pt rates her energy level as a 0/10.  States her faith, her family and God hold her up when she cannot hold herself up.  Dione Housekeeper 06/10/2020

## 2020-06-11 MED ORDER — PANTOPRAZOLE SODIUM 40 MG PO TBEC
40.0000 mg | DELAYED_RELEASE_TABLET | Freq: Every day | ORAL | 0 refills | Status: AC
Start: 2020-06-12 — End: ?

## 2020-06-11 MED ORDER — HYDROXYZINE HCL 50 MG PO TABS
50.0000 mg | ORAL_TABLET | Freq: Three times a day (TID) | ORAL | Status: DC | PRN
  Administered 2020-06-11 – 2020-06-14 (×9): 50 mg via ORAL
  Filled 2020-06-11 (×7): qty 1
  Filled 2020-06-11: qty 10
  Filled 2020-06-11 (×2): qty 1

## 2020-06-11 MED ORDER — QUETIAPINE FUMARATE 100 MG PO TABS: 100.0000 mg | ORAL_TABLET | Freq: Every day | ORAL | 0 refills | Status: DC

## 2020-06-11 MED ORDER — SERTRALINE HCL 100 MG PO TABS
100.0000 mg | ORAL_TABLET | Freq: Every day | ORAL | 0 refills | Status: AC
Start: 2020-06-12 — End: ?

## 2020-06-11 MED ORDER — QUETIAPINE FUMARATE 50 MG PO TABS
150.0000 mg | ORAL_TABLET | Freq: Every day | ORAL | Status: DC
  Administered 2020-06-11 – 2020-06-13 (×3): 150 mg via ORAL
  Filled 2020-06-11 (×3): qty 21
  Filled 2020-06-11: qty 3

## 2020-06-11 MED ORDER — AMOXICILLIN-POT CLAVULANATE 875-125 MG PO TABS: 1.0000 | ORAL_TABLET | Freq: Two times a day (BID) | ORAL | Status: DC

## 2020-06-11 MED ORDER — HYDROXYZINE HCL 50 MG PO TABS
50.0000 mg | ORAL_TABLET | Freq: Three times a day (TID) | ORAL | 0 refills | Status: AC | PRN
Start: 2020-06-11 — End: ?

## 2020-06-11 MED ORDER — PRAZOSIN HCL 1 MG PO CAPS
1.0000 mg | ORAL_CAPSULE | Freq: Every day | ORAL | 0 refills | Status: AC
Start: 2020-06-11 — End: ?

## 2020-06-11 MED ORDER — NICOTINE 21 MG/24HR TD PT24
21.0000 mg | MEDICATED_PATCH | Freq: Every day | TRANSDERMAL | 0 refills | Status: AC
Start: 2020-06-12 — End: ?

## 2020-06-11 MED ORDER — GABAPENTIN 300 MG PO CAPS: 900.0000 mg | ORAL_CAPSULE | Freq: Two times a day (BID) | ORAL | 0 refills | Status: AC

## 2020-06-11 MED ORDER — SALINE SPRAY 0.65 % NA SOLN: 1.0000 | NASAL | 0 refills | Status: AC | PRN

## 2020-06-11 MED ORDER — POLYETHYLENE GLYCOL 3350 17 G PO PACK
17.0000 g | PACK | Freq: Every day | ORAL | 0 refills | Status: AC
Start: 2020-06-12 — End: ?

## 2020-06-11 MED ORDER — NAPHAZOLINE-GLYCERIN 0.012-0.2 % OP SOLN
1.0000 [drp] | Freq: Four times a day (QID) | OPHTHALMIC | 0 refills | Status: AC | PRN
Start: 2020-06-11 — End: ?

## 2020-06-11 NOTE — [facility] Suicide Risk Assessment (Addendum)
[facility] INPATIENT:  Family/Significant Other Suicide Prevention Education  Suicide Prevention Education:  Contact Attempts: Sister, Catha Nottingham [phone]) has been identified by the patient as the family member/significant other with whom the patient will be residing, and identified as the person(s) who will aid the patient in the event of a mental health crisis.  With written consent from the patient, two attempts were made to provide suicide prevention education, prior to and/or following the patient's discharge.  We were unsuccessful in providing suicide prevention education.  A suicide education pamphlet was given to the patient to share with family/significant other.   Date and time of first attempt: 06/11/20 at 10:24AM CSW was unable to leave voicemail.  Date and time of second attempt: 06/14/20 at 10:00AM CSW was unable to leave voicemail.   Unable to reach supports, SPE pamphlet placed on chart for patient to share with supports at discharge.   Ruthann Cancer MSW, LCSW Clincal Social Worker  [facility]

## 2020-06-11 NOTE — Progress Notes (Signed)
CSW faxed pt's application to [facility] for residential treatment.    Wells Guiles, MSW, LCSW, LCAS Clinical Social Worker II Disposition CSW [phone]

## 2020-06-11 NOTE — Progress Notes (Signed)
   06/11/20 0939  COVID-19 Daily Checkoff  Have you had a fever (temp > 37.80C/100F)  in the past 24 hours?  No  If you have had runny nose, nasal congestion, sneezing in the past 24 hours, has it worsened? No  COVID-19 EXPOSURE  Have you traveled outside the state in the past 14 days? No  Have you been in contact with someone with a confirmed diagnosis of COVID-19 or PUI in the past 14 days without wearing appropriate PPE? No  Have you been living in the same home as a person with confirmed diagnosis of COVID-19 or a PUI (household contact)? No  Have you been diagnosed with COVID-19? No

## 2020-06-11 NOTE — Progress Notes (Signed)
Pt has been pleasant and active in the milieu tonight. Pt said that she has tried to contact several shelters today, but hasn't had any success yet. Pt said that her goal is to get into a rehab center for substances. Pt said that she has shared this information with her social worker and that they are both working on it. Pt is passively suicidal without a plan and verbally contracts for safety. Pt verbalizes understanding to alert staff immediately if she has thoughts of hurting herself or others with or without a plan. Pt denies HI and AVH. Medications administered as ordered by MD along with adequate pain management for facial trauma. Active listening, reassurance, and support provided. Q 15 min safety checks continue. Pt's safety has been maintained.    06/11/20 2105  Psych Admission Type (Psych Patients Only)  Admission Status Voluntary  Psychosocial Assessment  Patient Complaints Anxiety;Depression;Sadness;Worrying  Eye Contact Fair  Facial Expression Anxious  Affect Appropriate to circumstance  Speech Logical/coherent  Interaction Assertive  Motor Activity Slow  Appearance/Hygiene Unremarkable  Behavior Characteristics Cooperative;Appropriate to situation;Anxious;Calm  Mood Depressed;Anxious;Sad;Pleasant  Thought Process  Coherency WDL  Content WDL  Delusions None reported or observed  Perception WDL  Hallucination None reported or observed  Judgment WDL  Confusion None  Danger to Self  Current suicidal ideation? Passive  Self-Injurious Behavior No self-injurious ideation or behavior indicators observed or expressed   Agreement Not to Harm Self Yes  Description of Agreement verbally contracts for safety, alert staff immediately for thoughts of hurting yourself/others  Danger to Others  Danger to Others None reported or observed

## 2020-06-11 NOTE — Progress Notes (Signed)
   06/11/20 2105  COVID-19 Daily Checkoff  Have you had a fever (temp > 37.80C/100F)  in the past 24 hours?  No  COVID-19 EXPOSURE  Have you traveled outside the state in the past 14 days? No  Have you been in contact with someone with a confirmed diagnosis of COVID-19 or PUI in the past 14 days without wearing appropriate PPE? No  Have you been living in the same home as a person with confirmed diagnosis of COVID-19 or a PUI (household contact)? No  Have you been diagnosed with COVID-19? No

## 2020-06-11 NOTE — Progress Notes (Signed)
Patient rated her day as a 7 out of a  Possible 10. She states that she didn't have any panic attacks and that she spent a lot of time calling for housing. Her goal for tomorrow is to locate  Housing for herself.
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
## 2020-06-11 NOTE — [facility] Counselor (Signed)
CSW provided [employer] information to this patient to review. CSW also emailed referral to [facility], which this patient had completed.   CSW provided this patient with an applicaiton to [facility] for possible placement.     Ruthann Cancer MSW, LCSW Clincal Social Worker  [facility]
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
## 2020-06-11 NOTE — Progress Notes (Signed)
Recreation Therapy Notes  Date: 9.6.21 Time: 0930 Location: 300 Hall Group Room  Group Topic: Stress Management  Goal Area(s) Addresses:  Patient will identify positive stress management techniques. Patient will identify benefits of using stress management post d/c.  Intervention: Stress Management  Activity:  Meditation. LRT played Ponce meditation that focused on making choices.  Patients were to listen and follow along as meditation played to fully engage in activity.    Education:  Stress Management, Discharge Planning.   Education Outcome: Acknowledges Education  Clinical Observations/Feedback: Pt did not attend group activity.   Courtney Ponce, LRT/CTRS         Courtney Ponce 06/11/2020 11:04 AM 

## 2020-06-11 NOTE — Progress Notes (Signed)
Adult Psychoeducational Group Note  Date:  06/11/2020 Time:  2:54 AM  Group Topic/Focus:  Wrap-Up Group:   The focus of this group is to help patients review their daily goal of treatment and discuss progress on daily workbooks.  Participation Level:  Active  Participation Quality:  Appropriate  Affect:  Appropriate  Cognitive:  Appropriate  Insight: Appropriate  Engagement in Group:  Engaged  Modes of Intervention:  Discussion  Additional Comments:  Pt attend wrap up group. Her day was a 4. The one positive thing that happen she talked to her daughter.  Charna Busman Long 06/11/2020, 2:54 AM

## 2020-06-12 MED ORDER — OXYCODONE HCL 5 MG PO TABS
5.0000 mg | ORAL_TABLET | Freq: Three times a day (TID) | ORAL | Status: DC | PRN
  Administered 2020-06-12 – 2020-06-13 (×2): 5 mg via ORAL
  Filled 2020-06-12 (×2): qty 1
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
## 2020-06-12 NOTE — Progress Notes (Signed)
Patient states that she had a very productive day since she had a long talk with a rehab program. Her goal for tomorrow is to follow up with the rehab program.

## 2020-06-12 NOTE — Progress Notes (Signed)
DAR NOTE: Patient presents with anxious affect and mood.  Report passive SI but verbally contracts for safety. described energy level as low and concentration as poor.  Rates depression at 8, hopelessness at 7, and anxiety at 8.  Maintained on routine safety checks.  Medications given as prescribed.  Support and encouragement offered as needed.  Attended group and participated.  States goal for today is 'getting into Residential rehab and staying positive."  Patient visible in milieu with minimal interaction with staff and peers.  Requested and received multiple pain medications.  Patient is safe on and off the unit.

## 2020-06-13 MED ORDER — OXYCODONE HCL 5 MG PO TABS
5.0000 mg | ORAL_TABLET | Freq: Two times a day (BID) | ORAL | Status: DC | PRN
  Administered 2020-06-13 – 2020-06-14 (×2): 5 mg via ORAL
  Filled 2020-06-13 (×2): qty 1
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
## 2020-06-13 NOTE — Progress Notes (Signed)
[facility] Group Notes:  (Nursing/MHT/Case Management/Adjunct)  Date:  06/13/2020  Time:  2030  Type of Therapy:  wrap up group  Participation Level:  Active  Participation Quality:  Appropriate, Attentive, Sharing and Supportive  Affect:  Anxious  Cognitive:  Appropriate  Insight:  Improving  Engagement in Group:  Engaged  Modes of Intervention:  Clarification, Education and Support  Summary of Progress/Problems: Positive thinking and self-care were discussed.   Marcille Buffy 06/13/2020, 8:46 PM

## 2020-06-14 MED ORDER — QUETIAPINE FUMARATE 200 MG PO TABS
200.0000 mg | ORAL_TABLET | Freq: Every day | ORAL | Status: DC
  Filled 2020-06-14: qty 7

## 2020-06-14 MED ORDER — QUETIAPINE FUMARATE 200 MG PO TABS: 200.0000 mg | ORAL_TABLET | Freq: Every day | ORAL | 0 refills | Status: AC

## 2020-06-14 NOTE — [facility] Suicide Risk Assessment (Signed)
[facility] Discharge Suicide Risk Assessment   Principal Problem: Severe major depression without psychotic features [facility]) Discharge Diagnoses: Principal Problem:   Severe major depression without psychotic features (HCC) Active Problems:   MDD (major depressive disorder)   Total Time spent with patient: 15 minutes  Musculoskeletal: Strength & Muscle Tone: within normal limits Gait & Station: normal Patient leans: N/A  Psychiatric Specialty Exam: Review of Systems  HENT: Positive for facial swelling.   Musculoskeletal: Positive for myalgias.  All other systems reviewed and are negative.   Blood pressure 104/78, pulse 75, temperature (!) 97.5 F (36.4 C), temperature source Oral, resp. rate 20, height 5\' 3"  (1.6 m), weight 68 kg, SpO2 99 %.Body mass index is 26.57 kg/m.  General Appearance: Casual  Eye Contact::  Fair  Speech:  Normal Rate409  Volume:  Normal  Mood:  Anxious  Affect:  Congruent  Thought Process:  Coherent and Descriptions of Associations: Intact  Orientation:  Full (Time, Place, and Person)  Thought Content:  Logical  Suicidal Thoughts:  No  Homicidal Thoughts:  No  Memory:  Immediate;   Fair Recent;   Fair Remote;   Fair  Judgement:  Intact  Insight:  Fair  Psychomotor Activity:  Increased  Concentration:  Fair  Recall:  [id number] of Knowledge:Fair  Language: Good  Akathisia:  Negative  Handed:  Right  AIMS (if indicated):     Assets:  Desire for Improvement Housing Resilience  Sleep:  Number of Hours: 2.25  Cognition: WNL  ADL's:  Intact   Mental Status Per Nursing Assessment::   On Admission:  Self-harm thoughts  Demographic Factors:  Divorced or widowed, Caucasian, Low socioeconomic status and Unemployed  Loss Factors: Loss of significant relationship, Decline in physical health and Financial problems/change in socioeconomic status  Historical Factors: Impulsivity  Risk Reduction Factors:   Living with another person, especially a  relative  Continued Clinical Symptoms:  Severe Anxiety and/or Agitation Bipolar Disorder:   Depressive phase Alcohol/Substance Abuse/Dependencies  Cognitive Features That Contribute To Risk:  None    Suicide Risk:  Minimal: No identifiable suicidal ideation.  Patients presenting with no risk factors but with morbid ruminations; may be classified as minimal risk based on the severity of the depressive symptoms   Follow-up Information    [facility] Go on 06/08/2020.   Specialty: Professional Counselor Why: To establish care with this provider for therapy and medication management services, please go during their walk in hours: Monday through Friday,  8:30 am - 12:00 pm and 1:00 - 2:30 pm.  Please bring your photo ID, discharge summary, and proof of income. Contact information: [address] [place] [id number]        [facility]. Go to.   Why: A referral has been sent to this provider for suboxone treatment services.  PLEASE GO TO THIS PROVIDER during their Walk in Triage hours: Monday through Friday from 7:00 am to 11:00 am.   Contact information: [address] [place], [place]  P:  [phone] F:  [phone]       [facility]. Call.   Why: You may also use this provider for suboxone treatment services.  Call during the hours of 5:00 am and 10:00 am. Contact information: [address] [place], [place]  Phone: [phone] Fax: [phone]               [facility] Follow up.   Specialty: Family  Medicine Contact information: [address] [place] [address] [phone]               Plan Of Care/Follow-up recommendations:  Activity:  ad lib  Antonieta Pert, MD 06/14/2020, 7:11 AM
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
## 2020-06-14 NOTE — Progress Notes (Signed)
Discharge Note:  Patient denies SI/HI AVH at this time. Discharge instructions, AVS, prescriptions and transition record gone over with patient. Patient agrees to comply with medication management, follow-up visit, and outpatient therapy. Patient belongings returned to patient. Patient questions and concerns addressed and answered.  Patient ambulatory off unit.  Patient discharged.  

## 2020-06-14 NOTE — Progress Notes (Signed)
  [facility] Adult Case Management Discharge Plan :  Will you be returning to the same living situation after discharge:  No. Will be staying with daughter. At discharge, do you have transportation home?: No. Safe Transport will be arranged  Do you have the ability to pay for your medications: No. Samples to be provided at discharge   Release of information consent forms completed and in the chart;  Patient's signature needed at discharge.  Patient to Follow up at:  Follow-up Information    [facility] Go on 06/08/2020.   Specialty: Professional Counselor Why: To establish care with this provider for therapy and medication management services, please go during their walk in hours: Monday through Friday,  8:30 am - 12:00 pm and 1:00 - 2:30 pm.  Please bring your photo ID, discharge summary, and proof of income. Contact information: [address] [place] [address] [phone]        [facility]. Go to.   Why: A referral has been sent to this provider for suboxone treatment services.  PLEASE GO TO THIS PROVIDER during their Walk in Triage hours: Monday through Friday from 7:00 am to 11:00 am.   Contact information: [address] [place], [place] [address]  P:  [phone] F:  [phone]       [facility]. Call.   Why: You may also use this provider for suboxone treatment services.  Call during the hours of 5:00 am and 10:00 am. Contact information: [address] [place], [place] [address]  Phone: [phone] Fax: [phone]               [facility] Follow up.   Specialty: Family Medicine Contact information: [address] [place] [address] [phone]               Next level of care provider has access to [facility] Link:no  Safety Planning and Suicide Prevention discussed: Yes,  with patient      Has patient been referred to the Quitline?: Patient refused  referral  Patient has been referred for addiction treatment: Pt. refused referral  Otelia Santee, LCSW 06/14/2020, 9:46 AM
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
